# Patient Record
Sex: Male | Born: 1965 | Race: Black or African American | Hispanic: No | Marital: Married | Smoking: Never smoker
Health system: Southern US, Community
[De-identification: ages and names within clinical notes are randomized; demographics above are authoritative.]

## PROBLEM LIST (undated history)

## (undated) DIAGNOSIS — M25569 Pain in unspecified knee: Secondary | ICD-10-CM

## (undated) DIAGNOSIS — E785 Hyperlipidemia, unspecified: Secondary | ICD-10-CM

## (undated) DIAGNOSIS — J45909 Unspecified asthma, uncomplicated: Secondary | ICD-10-CM

## (undated) DIAGNOSIS — R7989 Other specified abnormal findings of blood chemistry: Secondary | ICD-10-CM

## (undated) DIAGNOSIS — M549 Dorsalgia, unspecified: Secondary | ICD-10-CM

## (undated) HISTORY — DX: Unspecified asthma, uncomplicated: J45.909

## (undated) HISTORY — DX: Dorsalgia, unspecified: M54.9

## (undated) HISTORY — DX: Hyperlipidemia, unspecified: E78.5

## (undated) HISTORY — DX: Other specified abnormal findings of blood chemistry: R79.89

## (undated) HISTORY — DX: Pain in unspecified knee: M25.569

---

## 2005-12-23 ENCOUNTER — Ambulatory Visit: Payer: Self-pay | Admitting: Family Medicine

## 2006-01-06 ENCOUNTER — Ambulatory Visit: Payer: Self-pay | Admitting: Family Medicine

## 2006-01-06 LAB — CONVERTED CEMR LAB: Blood Glucose, Fasting: 92 mg/dL

## 2006-04-28 ENCOUNTER — Ambulatory Visit: Payer: Self-pay | Admitting: Family Medicine

## 2007-11-25 ENCOUNTER — Encounter: Payer: Self-pay | Admitting: Family Medicine

## 2007-11-25 DIAGNOSIS — J45909 Unspecified asthma, uncomplicated: Secondary | ICD-10-CM | POA: Insufficient documentation

## 2007-12-30 ENCOUNTER — Ambulatory Visit: Payer: Self-pay | Admitting: Family Medicine

## 2007-12-30 DIAGNOSIS — S335XXA Sprain of ligaments of lumbar spine, initial encounter: Secondary | ICD-10-CM

## 2008-02-15 ENCOUNTER — Telehealth: Payer: Self-pay | Admitting: Family Medicine

## 2008-03-08 ENCOUNTER — Encounter: Payer: Self-pay | Admitting: Family Medicine

## 2008-04-05 ENCOUNTER — Ambulatory Visit: Payer: Self-pay | Admitting: Family Medicine

## 2008-04-05 DIAGNOSIS — H6120 Impacted cerumen, unspecified ear: Secondary | ICD-10-CM | POA: Insufficient documentation

## 2008-05-31 ENCOUNTER — Ambulatory Visit: Payer: Self-pay | Admitting: Family Medicine

## 2008-06-21 ENCOUNTER — Ambulatory Visit: Payer: Self-pay | Admitting: Family Medicine

## 2008-06-22 LAB — CONVERTED CEMR LAB
Albumin: 3.5 g/dL (ref 3.5–5.2)
BUN: 20 mg/dL (ref 6–23)
Calcium: 9.3 mg/dL (ref 8.4–10.5)
Cholesterol: 222 mg/dL (ref 0–200)
Creatinine, Ser: 1.3 mg/dL (ref 0.4–1.5)
Direct LDL: 155.6 mg/dL
GFR calc Af Amer: 77 mL/min
GFR calc non Af Amer: 64 mL/min
HDL: 38.9 mg/dL — ABNORMAL LOW (ref >39.0)
Triglycerides: 107 mg/dL (ref 0–149)
VLDL: 21 mg/dL (ref 0–40)

## 2008-12-29 ENCOUNTER — Encounter (INDEPENDENT_AMBULATORY_CARE_PROVIDER_SITE_OTHER): Payer: Self-pay | Admitting: Surgery

## 2008-12-29 ENCOUNTER — Ambulatory Visit (HOSPITAL_BASED_OUTPATIENT_CLINIC_OR_DEPARTMENT_OTHER): Admission: RE | Admit: 2008-12-29 | Discharge: 2008-12-29 | Payer: Self-pay | Admitting: Surgery

## 2008-12-29 HISTORY — PX: LIPOMA EXCISION: SHX5283

## 2009-01-30 ENCOUNTER — Ambulatory Visit: Payer: Self-pay | Admitting: Family Medicine

## 2009-06-26 ENCOUNTER — Ambulatory Visit: Payer: Self-pay | Admitting: Family Medicine

## 2009-06-28 ENCOUNTER — Ambulatory Visit: Payer: Self-pay | Admitting: Family Medicine

## 2009-06-28 DIAGNOSIS — E7800 Pure hypercholesterolemia, unspecified: Secondary | ICD-10-CM

## 2009-06-28 LAB — CONVERTED CEMR LAB
AST: 20 units/L (ref 0–37)
Alkaline Phosphatase: 33 units/L — ABNORMAL LOW (ref 39–117)
Calcium: 9.3 mg/dL (ref 8.4–10.5)
Cholesterol: 257 mg/dL — ABNORMAL HIGH (ref 0–200)
Direct LDL: 191 mg/dL
GFR calc non Af Amer: 60.44 mL/min (ref >60)
HDL: 48.3 mg/dL (ref >39.00)
Potassium: 4.6 meq/L (ref 3.5–5.1)
Sodium: 142 meq/L (ref 135–145)
Total Bilirubin: 0.8 mg/dL (ref 0.3–1.2)
Total CHOL/HDL Ratio: 5
VLDL: 16.2 mg/dL (ref 0.0–40.0)

## 2009-07-03 ENCOUNTER — Ambulatory Visit: Payer: Self-pay | Admitting: Family Medicine

## 2009-07-03 DIAGNOSIS — M722 Plantar fascial fibromatosis: Secondary | ICD-10-CM | POA: Insufficient documentation

## 2009-08-15 ENCOUNTER — Ambulatory Visit: Payer: Self-pay | Admitting: Family Medicine

## 2009-08-15 LAB — CONVERTED CEMR LAB: ALT: 34 units/L (ref 0–53)

## 2009-10-03 ENCOUNTER — Ambulatory Visit: Payer: Self-pay | Admitting: Family Medicine

## 2009-10-03 LAB — CONVERTED CEMR LAB
BUN: 19 mg/dL (ref 6–23)
Chloride: 106 meq/L (ref 96–112)
Cholesterol: 233 mg/dL — ABNORMAL HIGH (ref 0–200)
Creatinine, Ser: 1.3 mg/dL (ref 0.4–1.5)
Direct LDL: 162.2 mg/dL
GFR calc non Af Amer: 76.71 mL/min (ref >60)
HDL: 52.3 mg/dL (ref >39.00)
Total CHOL/HDL Ratio: 4
VLDL: 18.6 mg/dL (ref 0.0–40.0)

## 2009-10-11 ENCOUNTER — Ambulatory Visit: Payer: Self-pay | Admitting: Family Medicine

## 2009-11-20 ENCOUNTER — Ambulatory Visit: Payer: Self-pay | Admitting: Family Medicine

## 2010-03-26 ENCOUNTER — Encounter (INDEPENDENT_AMBULATORY_CARE_PROVIDER_SITE_OTHER): Payer: Self-pay | Admitting: *Deleted

## 2010-07-01 ENCOUNTER — Telehealth: Payer: Self-pay | Admitting: Family Medicine

## 2010-07-08 ENCOUNTER — Telehealth (INDEPENDENT_AMBULATORY_CARE_PROVIDER_SITE_OTHER): Payer: Self-pay | Admitting: *Deleted

## 2010-07-15 ENCOUNTER — Ambulatory Visit: Payer: Self-pay | Admitting: Family Medicine

## 2010-07-15 LAB — CONVERTED CEMR LAB
Alkaline Phosphatase: 29 units/L — ABNORMAL LOW (ref 39–117)
Bilirubin, Direct: 0.1 mg/dL (ref 0.0–0.3)
CO2: 27 meq/L (ref 19–32)
Calcium: 9.3 mg/dL (ref 8.4–10.5)
Creatinine, Ser: 1.5 mg/dL (ref 0.4–1.5)
Direct LDL: 193.9 mg/dL
GFR calc non Af Amer: 64.31 mL/min (ref >60)
HDL: 53.4 mg/dL (ref >39.00)
Sodium: 140 meq/L (ref 135–145)
Total Bilirubin: 0.5 mg/dL (ref 0.3–1.2)
Total CHOL/HDL Ratio: 5
Total Protein: 6.8 g/dL (ref 6.0–8.3)
Triglycerides: 71 mg/dL (ref 0.0–149.0)
VLDL: 14.2 mg/dL (ref 0.0–40.0)

## 2010-07-18 ENCOUNTER — Ambulatory Visit: Payer: Self-pay | Admitting: Family Medicine

## 2010-09-17 NOTE — Progress Notes (Signed)
----   Converted from flag ---- ---- 07/05/2010 1:01 PM, Crawford Givens MD wrote: cmet/lipid 272.0  ---- 07/05/2010 12:10 PM, Liane Comber CMA (AAMA) wrote: Lab orders please! Good Morning! This pt is scheduled for cpx labs Monday, which labs to draw and dx codes to use? Thanks Tasha ------------------------------

## 2010-09-17 NOTE — Assessment & Plan Note (Signed)
Summary: CPX/JRR   Vital Signs:  Patient profile:   45 year old male Height:      70 inches Weight:      208.75 pounds BMI:     30.06 Temp:     97.9 degrees F oral Pulse rate:   72 / minute Pulse rhythm:   regular BP sitting:   124 / 84  (left arm) Cuff size:   large  Vitals Entered By: Delilah Shan CMA Duncan Dull) (July 18, 2010 8:37 AM) CC: CPX   History of Present Illness: CPE- see plan.  H/o mild increase in cr- Frequent ibuprofen use and h/o protein supplements/creatine supplements.  Non h/o htn.  See plan.   h/o HLD.  Labs d/w patient.  See plan.   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 Father dec 62  5/10 prostate cancer (dx'd late 22s, early 70s) Mother  dec 55 diabetes// MSOF in hospital  Siblings: None cv: negative hbp: + M UNCLES DM + MOTHER AND M AUNTS GOUT/ARTHRITIS: PROSTATE CANCER: NEGATIVE BREAST/OVARIAN.UTERINE CANCER: NEGATIVE COLON CANCER:NEGATIVE DEPRESSION: NEGATIVE ETOH ABUSE: +M UNCLE DRUG ABUSE: NEGATIVE OTHER: NEGATIVE STROKE  Social History: Reviewed history from 11/25/2007 and no changes required. 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  Otherwise negative.    Physical Exam  General:  GEN: nad, alert and oriented, muscular HEENT: mucous membranes moist NECK: supple w/o LA CV: rrr.  no murmur PULM: ctab, no inc wob ABD:  soft, +bs EXT: no edema SKIN: no acute rash    Impression & Recommendations:  Problem # 1:  HEALTH MAINTENANCE EXAM (ICD-V70.0) flu shot done today and tdap up to date.  d/w patient ZO:XWRU and cont exercise.  Will need prostate CA screening in late 40s and colon Ca screen at 50.    Problem # 2:  RENAL INSUFFICIENCY (ICD-588.9) D/w patient EA:[id number] NSAIDS and supplements and rechecking cr in 6 months.  he agrees. Use ultram as needed for joint pain.    Problem # 3:  PURE HYPERCHOLESTEROLEMIA (ICD-272.0) return for labs after stopping supplements.  Consider statin if still elevated.   Complete Medication List: 1)  Multivitamins Tabs (Multiple vitamin) .Marland Kitchen.. 1 daily by mouth 2)  Ultram 50 Mg Tabs (Tramadol hcl) .... One tab by mouth 4 times a day as needed for back pain 3)  Glucosamine-chondroitin 500-400 Mg Caps (Glucosamine-chondroitin) .... Take one by mouth daily 4)  Guaifenesin 400 Mg Tabs (Guaifenesin) .... As needed  Other Orders: Admin 1st Vaccine (14782) Flu Vaccine 17yrs + (95621)  Patient Instructions: 1)  Recheck lipids and cmet in 6 months.  fasting.  272.0 2)  Glad to see you today.  Let me know if you have other concerns.  3)  I sent in your tramadol to [facility].  4)  Stop taking ibuprofen and the weight lifting supplements.   Prescriptions: ULTRAM 50 MG  TABS (TRAMADOL HCL) one tab by mouth 4  times a day as needed for back pain  #360 x 3   Entered and Authorized by:   Crawford Givens MD   Signed by:   Crawford Givens MD on 07/18/2010   Method used:   Electronically to        [facility] [address] (retail)       [address], [address]       [place], [place]  [address]       Ph: [phone]       Fax: [phone]   RxID:   [id number]    Orders Added: 1)  New Patient 40-64 years [99386] 2)  Est. Patient Level III [99213] 3)  Admin 1st Vaccine [90471] 4)  Flu Vaccine 31yrs + [03474]    Current Allergies (reviewed  today): No known allergies Flu Vaccine Consent Questions     Do you have a history of severe allergic reactions to this vaccine? no    Any prior history of allergic reactions to egg and/or gelatin? no    Do you have a sensitivity to the preservative Thimersol? no    Do you have a past history of Guillan-Barre Syndrome? no    Do you currently have an acute febrile illness? no    Have you ever had a severe reaction to latex? no    Vaccine information given and explained to patient? yes    Are you currently pregnant? no    Lot Number:[id number]   Exp Date:02/15/2011   Site Given  Left Deltoid IM  Lugene Fuquay CMA (AAMA)  July 18, 2010 9:32 AM   .lbflu

## 2010-09-17 NOTE — Progress Notes (Signed)
Summary: Rx Tramadol  Phone Note Refill Request Call back at [phone] Message from:  [facility] on July 01, 2010 3:59 PM  Refills Requested: Medication #1:  ULTRAM 50 MG  TABS one tab by mouth twice  daily as needed for back pain   Last Refilled: 05/19/2010 Patient is scheduled to see you 07/18/10.  Patient called also, please let patient know when this is refilled.   Method Requested: Electronic Initial call taken by: Sydell Axon LPN,  July 01, 2010 4:00 PM  Follow-up for Phone Call        please notify patient.  Follow-up by: Crawford Givens MD,  July 01, 2010 10:20 PM  Additional Follow-up for Phone Call Additional follow up Details #1::        Medication phoned to pharmacy. Patient Advised.  Additional Follow-up by: Delilah Shan CMA Duncan Dull),  July 02, 2010 9:55 AM    Prescriptions: ULTRAM 50 MG  TABS (TRAMADOL HCL) one tab by mouth twice  daily as needed for back pain  #80 x 2   Entered and Authorized by:   Crawford Givens MD   Signed by:   Crawford Givens MD on 07/01/2010   Method used:   Electronically to        [facility] [address] (retail)       [address]       [place], [place]  [address]       Ph: [phone]       Fax: [phone]   RxID:   [id number]

## 2010-09-17 NOTE — Assessment & Plan Note (Signed)
Summary: sore throat/alc   Vital Signs:  Patient profile:   45 year old male Weight:      210.75 pounds BMI:     30.35 Temp:     97.9 degrees F oral Pulse rate:   60 / minute Pulse rhythm:   regular BP sitting:   124 / 80  (left arm) Cuff size:   large  Vitals Entered By: Sydell Axon LPN (November 20, 1608 3:56 PM) CC: Swollen glands, right ear aches and sore throat   History of Present Illness: Pt nhere for congestrion. He denies fever or chills, he has no headache, right ear hurts some, no rhinitis but mild nasal congestionb, ST with right submandibular gland hypertrophy,  mild cough nonproductive. He has no SOB, no N/V. He is sleeping ok from the congestion. Her has taken Dayquil some. Otherwise has tqken nothing else. He has had ST and congestion off and on now for quite some time.  Problems Prior to Update: 1)  Uri  (ICD-465.9) 2)  Plantar Fasciitis, Bilateral  (ICD-728.71) 3)  Renal Insufficiency  (ICD-588.9) 4)  Pure Hypercholesterolemia  (ICD-272.0) 5)  Health Maintenance Exam  (ICD-V70.0) 6)  Cerumen Impaction, Left  (ICD-380.4) 7)  Back Strain,upper Umbar  (ICD-847.2) 8)  Lipoma, Skin, R Posterior Neck  (ICD-214.1) 9)  Asthma, Childhood  (ICD-493.00)  Medications Prior to Update: 1)  Multivitamins   Tabs (Multiple Vitamin) .Marland Kitchen.. 1 Daily By Mouth 2)  Ultram 50 Mg  Tabs (Tramadol Hcl) .... One Tab By Mouth Twice  Daily As Needed For Back Pain 3)  Glucosamine-Chondroitin 500-400 Mg Caps (Glucosamine-Chondroitin) .... Take One By Mouth Daily 4)  Guaifenesin 400 Mg Tabs (Guaifenesin) .... As Needed  Allergies: No Known Drug Allergies  Physical Exam  General:  Well-developed,well-nourished,in no acute distress; alert,appropriate and cooperative throughout examination Head:  Normocephalic and atraumatic without obvious abnormalities. No apparent alopecia or balding. Sinuses minimally tender in max distr. Eyes:  Conjunctiva clear bilaterally.  Ears:  External ear exam  shows no significant lesions or deformities.  Otoscopic examination reveals clear canals, tympanic membranes are intact bilaterally without bulging, retraction, inflammation or discharge. Hearing is grossly normal bilaterally. TMs dull. Nose:  External nasal examination shows no deformity or inflammation. Nasal mucosa are pink and moist without lesions or exudates. No real inflammation but mild clear discharge.. Mouth:  Oral mucosa and oropharynx without lesions or exudates.  Teeth in good repair. Mild thick PND. Neck:  No deformities, masses, or tenderness noted. Chest Wall:  No deformities, masses, tenderness or gynecomastia noted. Lungs:  Normal respiratory effort, chest expands symmetrically. Lungs are clear to auscultation, no crackles or wheezes. Heart:  Normal rate and regular rhythm. S1 and S2 normal without gallop, murmur, click, rub or other extra sounds.   Impression & Recommendations:  Problem # 1:  URI (ICD-465.9) Assessment Unchanged  Ongoing. See instructions. His updated medication list for this problem includes:    Guaifenesin 400 Mg Tabs (Guaifenesin) .Marland Kitchen... As needed  Instructed on symptomatic treatment. Call if symptoms persist or worsen.   Complete Medication List: 1)  Multivitamins Tabs (Multiple vitamin) .Marland Kitchen.. 1 daily by mouth 2)  Ultram 50 Mg Tabs (Tramadol hcl) .... One tab by mouth twice  daily as needed for back pain 3)  Glucosamine-chondroitin 500-400 Mg Caps (Glucosamine-chondroitin) .... Take one by mouth daily 4)  Guaifenesin 400 Mg Tabs (Guaifenesin) .... As needed 5)  Amoxicillin 500 Mg Caps (Amoxicillin) .... 2 tabs by mouth two times a day  Patient  Instructions: 1)  Take Amox  2)  Take Guaifenesin by [facility] or [facility] and getting MUCOUS RELIEF EXPECTORANT (400mg ), take 11/2 tabs by mouth AM and NOON. 3)  Drink lots of fluids anytime taking Guaifenesin.  4)  ake Tyl ES 2 tabs by mouth three times a day  5)  Keep lozenge in  mouth all the time. 6)  Gargle every 1/2 hr for 2 days. Prescriptions: AMOXICILLIN 500 MG CAPS (AMOXICILLIN) 2 tabs by mouth two times a day  #56 x 0   Entered and Authorized by:   Shaune Leeks MD   Signed by:   Shaune Leeks MD on 11/20/2009   Method used:   Electronically to        [facility] [address]ail)       [address], [address]       [place], [place]  [address]       Ph: [phone]       Fax: [phone]   RxID:   [id number]   Current Allergies (reviewed today): No known allergies

## 2010-09-17 NOTE — Letter (Signed)
Summary: Nadara Eaton letter  [place] [facility]  [address] [place], [place] [address]   Phone: [phone]  Fax: [phone]       03/26/2010 MRN: [id number]  [facility] [address] [place], [place]  [address]  Dear Mr. Heatherly,  [facility], and [facility] announce the retirement of Arta Silence, M.D., from full-time practice at the [facility] office effective February 14, 2010 and his plans of returning part-time.  It is important to Dr. Hetty Ely and to our practice that you understand that [facility] has seven physicians in our office for your health care needs.  We will continue to offer the same exceptional care that you have today.    Dr. Hetty Ely has spoken to many of you about his plans for retirement and returning part-time in the fall.   We will continue to work with you through the transition to schedule appointments for you in the office and meet the high standards that [facility] is committed to.   Again, it is with great pleasure that we share the news that Dr. Hetty Ely will return to [facility] in October of 2011 with a reduced schedule.    If you have any questions, or would like to request an appointment with one of our physicians, please call us at [phone] and press the option for Scheduling an appointment.  We take pleasure in providing you with excellent patient care and look forward to seeing you at your next office visit.  Our [facility] Physicians are:  Tillman Abide, M.D. Laurita Quint, M.D. Roxy Manns, M.D. Kerby Nora, M.D. Hannah Beat, M.D. Ruthe Mannan, M.D. We proudly welcomed Raechel Ache, M.D. and Eustaquio Boyden, M.D. to the practice in July/August 2011.  Sincerely,  [facility]

## 2010-09-17 NOTE — Assessment & Plan Note (Signed)
Summary: 3 MONTH FOLLOW UP/[facility]   Vital Signs:  Patient profile:   45 year old male Weight:      212 pounds Temp:     98.0 degrees F oral Pulse rate:   60 / minute Pulse rhythm:   regular BP sitting:   124 / 84  (left arm) Cuff size:   large  Vitals Entered By: Sydell Axon LPN (October 11, 2009 10:20 AM) CC: 3 Month follow-up after labs, stopped taking Pravachol because he did not think that it was good, has had a cold, coughing up a little stuff   History of Present Illness: Pt here for followup of chol. He stopped taking his statin because he increased his working out and wanted to avoid taking meds if he could. He feels well and has been doing more cardio. He has congestion. He had h/a last week with ST, no ear p[ain, mild nasal congestion and minimally productive cough. He denies fever or chills. His appetite has been fine and he has no N/V.  Problems Prior to Update: 1)  Plantar Fasciitis, Bilateral  (ICD-728.71) 2)  Renal Insufficiency  (ICD-588.9) 3)  Pure Hypercholesterolemia  (ICD-272.0) 4)  Health Maintenance Exam  (ICD-V70.0) 5)  Cerumen Impaction, Left  (ICD-380.4) 6)  Back Strain,upper Umbar  (ICD-847.2) 7)  Lipoma, Skin, R Posterior Neck  (ICD-214.1) 8)  Asthma, Childhood  (ICD-493.00)  Medications Prior to Update: 1)  Multivitamins   Tabs (Multiple Vitamin) .Marland Kitchen.. 1 Daily By Mouth 2)  Ultram 50 Mg  Tabs (Tramadol Hcl) .... One Tab By Mouth Twice  Daily As Needed For Back Pain 3)  Glucosamine-Chondroitin 500-400 Mg Caps (Glucosamine-Chondroitin) .... Take One By Mouth Daily 4)  Pravachol 40 Mg Tabs (Pravastatin Sodium) .... One Tab By Mouth At Night  Allergies: No Known Drug Allergies  Physical Exam  General:  Well-developed,well-nourished,in no acute distress; alert,appropriate and cooperative throughout examination Head:  Normocephalic and atraumatic without obvious abnormalities. No apparent alopecia or balding. Sinuses nontender. Eyes:  Conjunctiva clear  bilaterally.  Ears:  External ear exam shows no significant lesions or deformities.  Otoscopic examination reveals clear canals, tympanic membranes are intact bilaterally without bulging, retraction, inflammation or discharge. Hearing is grossly normal bilaterally. TMs dull. Nose:  External nasal examination shows no deformity or inflammation. Nasal mucosa are pink and moist without lesions or exudates. No real inflammation but mild clear discharge.. Mouth:  Oral mucosa and oropharynx without lesions or exudates.  Teeth in good repair. Mild thick PND. Neck:  No deformities, masses, or tenderness noted. Lungs:  Normal respiratory effort, chest expands symmetrically. Lungs are clear to auscultation, no crackles or wheezes. Heart:  Normal rate and regular rhythm. S1 and S2 normal without gallop, murmur, click, rub or other extra sounds.   Impression & Recommendations:  Problem # 1:  PURE HYPERCHOLESTEROLEMIA (ICD-272.0) Assessment Improved LDL from 191 to 162 with exercise which is alomost at goal of 160 for him....male his only risk factor.   OK if continues exercise level he is currently maintaining. The following medications were removed from the medication list:    Pravachol 40 Mg Tabs (Pravastatin sodium) ..... One tab by mouth at night  Problem # 2:  URI (ICD-465.9) Cont Guaif...discussed approach. Call if worsens. His updated medication list for this problem includes:    Guaifenesin 400 Mg Tabs (Guaifenesin) .Marland Kitchen... As needed  Instructed on symptomatic treatment. Call if symptoms persist or worsen.   Complete Medication List: 1)  Multivitamins Tabs (Multiple vitamin) .Marland KitchenMarland KitchenMarland Kitchen 1  daily by mouth 2)  Ultram 50 Mg Tabs (Tramadol hcl) .... One tab by mouth twice  daily as needed for back pain 3)  Glucosamine-chondroitin 500-400 Mg Caps (Glucosamine-chondroitin) .... Take one by mouth daily 4)  Guaifenesin 400 Mg Tabs (Guaifenesin) .... As needed  Patient Instructions: 1)  RTC 10/11 for  PE.  Current Allergies (reviewed today): No known allergies

## 2010-09-30 ENCOUNTER — Encounter: Payer: Self-pay | Admitting: Family Medicine

## 2010-09-30 ENCOUNTER — Ambulatory Visit (INDEPENDENT_AMBULATORY_CARE_PROVIDER_SITE_OTHER): Payer: Medicare HMO | Admitting: Family Medicine

## 2010-09-30 DIAGNOSIS — J069 Acute upper respiratory infection, unspecified: Secondary | ICD-10-CM | POA: Insufficient documentation

## 2010-10-09 NOTE — Assessment & Plan Note (Signed)
Summary: ST,SWOLLEN GLANDS/CLE   AETNA   Vital Signs:  Patient profile:   45 year old male Height:      70 inches Weight:      212.25 pounds BMI:     30.56 Temp:     98.4 degrees F oral Pulse rate:   76 / minute Pulse rhythm:   regular BP sitting:   122 / 70  (left arm) Cuff size:   large  Vitals Entered By: Delilah Shan CMA Jayden Kratochvil Dull) (September 30, 2010 11:31 AM) iCC: ? sinus infection, sinus pressure, ST, swollen glands   History of Present Illness: Sinus pressure and HA, congestion.  Had some swelling in this glands in neck.  Started about 1.5 weeks ago.    Started with ST but then this resolved.  No rhinorrhea, some AM sputum.  Some cough.  Out of work today.  "I'm getting better but slowly."    No FCNAVD.    +sick contacts.    Allergies: No Known Drug Allergies  Review of Systems       See HPI.  Otherwise negative.    Physical Exam  General:  GEN: nad, alert and oriented HEENT: mucous membranes moist, TM w/o erythema, nasal epithelium injected, OP with cobblestoning NECK: supple w/o LA CV: rrr. PULM: ctab, no inc wob ABD: soft, +bs EXT: no edema  max and frontal sinus not tender to palpation    Impression & Recommendations:  Problem # 1:  URI (ICD-465.9) Likely viral, resolving.  I would use otc meds below and follow up as needed.  Should resolve fully on its own.  He understood. Rationale d/w patient. nontoxic.  His updated medication list for this problem includes:    Guaifenesin 400 Mg Tabs (Guaifenesin) .Marland Kitchen... As needed    Claritin-d 12 Hour 5-120 Mg Xr12h-tab (Loratadine-pseudoephedrine) .Marland Kitchen... 1 by mouth two times a day as needed for congestion  Complete Medication List: 1)  Multivitamins Tabs (Multiple vitamin) .Marland Kitchen.. 1 daily by mouth 2)  Ultram 50 Mg Tabs (Tramadol hcl) .... One tab by mouth 4 times a day as needed for back pain 3)  Glucosamine-chondroitin 500-400 Mg Caps (Glucosamine-chondroitin) .... Take one by mouth daily 4)  Guaifenesin 400 Mg  Tabs (Guaifenesin) .... As needed 5)  Claritin-d 12 Hour 5-120 Mg Xr12h-tab (Loratadine-pseudoephedrine) .Marland Kitchen.. 1 by mouth two times a day as needed for congestion  Patient Instructions: 1)  Get plenty of rest, drink lots of clear liquids, and use Tylenol and claritin D as needed. This should gradually get better.  Take care.  Prescriptions: CLARITIN-D 12 HOUR 5-120 MG XR12H-TAB (LORATADINE-PSEUDOEPHEDRINE) 1 by mouth two times a day as needed for congestion  #30 x 0   Entered and Authorized by:   Crawford Givens MD   Signed by:   Crawford Givens MD on 09/30/2010   Method used:   Print then Give to Patient   RxID:   [id number]    Orders Added: 1)  Est. Patient Level III [14782]    Current Allergies (reviewed today): No known allergies

## 2010-10-09 NOTE — Letter (Signed)
Summary: Out of Work  [employer] at [facility]  [address] [place], [place] [address]   Phone: [phone]  Fax: [phone]    September 30, 2010   Employee:  [employer]ifton [employer]ust[employer]    To Whom It May Concern:   For Medical reasons, please excuse the above named employee from work for the following dates:  Start:   today  End:   return to work 10/01/10  If you need additional information, please feel free to contact our office.         Sincerely,    Crawford Givens MD

## 2010-11-26 LAB — POCT HEMOGLOBIN-HEMACUE: Hemoglobin: 15.5 g/dL (ref 13.0–17.0)

## 2010-12-31 NOTE — Op Note (Signed)
NAME:  DOLPH, TAVANO NO.:  [id number]   MEDICAL RECORD NO.:  [id number]          PATIENT TYPE:  AMB   LOCATION:  DSC                          FACILITY:  [facility]   PHYSICIAN:  Thornton Park. Daphine Deutscher, MD  DATE OF BIRTH:  [birth year]   DATE OF PROCEDURE:  DATE OF DISCHARGE:                               OPERATIVE REPORT   PREOPERATIVE DIAGNOSIS:  Mass of right posterior neck.   POSTOPERATIVE DIAGNOSIS:  Mass of right posterior neck.   PROCEDURE:  Excision of mass of the right posterior neck.   DESCRIPTION OF PROCEDURE:  Mr. Och, the place of his mass at the neck  was marked by me in the holding area, and he was taken back to room 8 on  Friday, Dec 29, 2008, and placed in the prone position with some  intravenous sedation given.  I prepped him with Techni-Care equivalent  and then draping sterilely.  I infiltrated the area with generous amount  of 1% lidocaine and neut.  A transverse incision following the  previously made mark was done, carried this down to the fairly thick  skin into a subcutaneous pocket finding about 3 cm x 3 cm fatty mass  that was encapsulated.  I teased this out.  I eventually removed it  using sharp dissection at the base and cauterized the blood supply.  No  other palpable mass was noted.  I then closed this in layers of 4-0  Vicryl subcutaneously and subcuticularly and with Dermabond.  The  patient tolerated procedure well.  He was given some Vicodin to take for  pain and will be followed up in the office about 3 weeks.      Thornton Park Daphine Deutscher, MD  Electronically Signed     MBM/MEDQ  D:  12/29/2008  T:  12/30/2008  Job:  [id number]   cc:   Arta Silence, MD

## 2011-01-21 ENCOUNTER — Other Ambulatory Visit: Payer: Self-pay | Admitting: Family Medicine

## 2011-01-21 DIAGNOSIS — E7800 Pure hypercholesterolemia, unspecified: Secondary | ICD-10-CM

## 2011-01-23 ENCOUNTER — Other Ambulatory Visit (INDEPENDENT_AMBULATORY_CARE_PROVIDER_SITE_OTHER): Payer: Managed Care, Other (non HMO) | Admitting: Family Medicine

## 2011-01-23 DIAGNOSIS — E7800 Pure hypercholesterolemia, unspecified: Secondary | ICD-10-CM

## 2011-01-23 LAB — LDL CHOLESTEROL, DIRECT: Direct LDL: 183.9 mg/dL

## 2011-01-23 LAB — COMPREHENSIVE METABOLIC PANEL WITH GFR
ALT: 30 U/L (ref 0–53)
AST: 28 U/L (ref 0–37)
Albumin: 3.9 g/dL (ref 3.5–5.2)
Alkaline Phosphatase: 33 U/L — ABNORMAL LOW (ref 39–117)
BUN: 21 mg/dL (ref 6–23)
CO2: 26 meq/L (ref 19–32)
Calcium: 8.6 mg/dL (ref 8.4–10.5)
Chloride: 104 meq/L (ref 96–112)
Creatinine, Ser: 1.4 mg/dL (ref 0.4–1.5)
GFR: 68.88 mL/min
Glucose, Bld: 101 mg/dL — ABNORMAL HIGH (ref 70–99)
Potassium: 4.3 meq/L (ref 3.5–5.1)
Sodium: 140 meq/L (ref 135–145)
Total Bilirubin: 0.3 mg/dL (ref 0.3–1.2)
Total Protein: 7 g/dL (ref 6.0–8.3)

## 2011-01-23 LAB — LIPID PANEL: Cholesterol: 266 mg/dL — ABNORMAL HIGH (ref 0–200)

## 2011-01-29 ENCOUNTER — Encounter: Payer: Self-pay | Admitting: Family Medicine

## 2011-01-31 ENCOUNTER — Encounter: Payer: Self-pay | Admitting: Family Medicine

## 2011-01-31 ENCOUNTER — Ambulatory Visit: Payer: Managed Care, Other (non HMO) | Admitting: Family Medicine

## 2011-01-31 ENCOUNTER — Ambulatory Visit (INDEPENDENT_AMBULATORY_CARE_PROVIDER_SITE_OTHER): Payer: Managed Care, Other (non HMO) | Admitting: Family Medicine

## 2011-01-31 DIAGNOSIS — E7800 Pure hypercholesterolemia, unspecified: Secondary | ICD-10-CM

## 2011-01-31 DIAGNOSIS — M25569 Pain in unspecified knee: Secondary | ICD-10-CM

## 2011-01-31 MED ORDER — TRAMADOL HCL 50 MG PO TABS: 50.0000 mg | ORAL_TABLET | Freq: Four times a day (QID) | ORAL | Status: AC | PRN

## 2011-01-31 NOTE — Progress Notes (Signed)
Knee pain, better with prn tramadol.  Still exercising and doing well o/w.   HLD- D/w pt about lipids.  No h/o early CAD in family.  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  No sig inc in weight.  He appears fit with good muscle mass.  No known CAD in patient.  No h/o HTN, prev CP checks all wnl in clinic at and home.  No CP/SOB/DM2 in the patient.  See plan.  Meds, vitals, and allergies reviewed.   ROS: See HPI.  Otherwise, noncontributory.  nad ncat Mmm rrr ctab Ext well perfused.

## 2011-01-31 NOTE — Patient Instructions (Signed)
I would keep a check on your BP occasionally.  Let me know if it is running >140/90.  Keep exercising and working on your diet.  Take care.  After talking with you and checking you today, I don't think you have to go on cholesterol medicine.

## 2011-02-02 ENCOUNTER — Encounter: Payer: Self-pay | Admitting: Family Medicine

## 2011-02-02 DIAGNOSIS — M25569 Pain in unspecified knee: Secondary | ICD-10-CM | POA: Insufficient documentation

## 2011-02-02 NOTE — Assessment & Plan Note (Signed)
I talked with patient about this.  No personal/FH CAD.  He isn't diabetic.  He is fit.  He doesn't smoke.  I can't guarantee the patient that he would tolerate or benefit from statin use.  He doesn't have definite indication.  At this point, I would continue exercise. He'll notify us of inc in BP.  I would check sugar periodically given FH of DM.  He agreed with the plan.

## 2011-02-02 NOTE — Assessment & Plan Note (Signed)
No change in meds, continue prn tramadol and exercising.

## 2011-03-31 ENCOUNTER — Ambulatory Visit (INDEPENDENT_AMBULATORY_CARE_PROVIDER_SITE_OTHER): Payer: Managed Care, Other (non HMO) | Admitting: Family Medicine

## 2011-03-31 ENCOUNTER — Encounter: Payer: Self-pay | Admitting: Family Medicine

## 2011-03-31 DIAGNOSIS — M79609 Pain in unspecified limb: Secondary | ICD-10-CM

## 2011-03-31 DIAGNOSIS — M79662 Pain in left lower leg: Secondary | ICD-10-CM | POA: Insufficient documentation

## 2011-03-31 DIAGNOSIS — R7989 Other specified abnormal findings of blood chemistry: Secondary | ICD-10-CM | POA: Insufficient documentation

## 2011-03-31 DIAGNOSIS — M79661 Pain in right lower leg: Secondary | ICD-10-CM

## 2011-03-31 DIAGNOSIS — R799 Abnormal finding of blood chemistry, unspecified: Secondary | ICD-10-CM

## 2011-03-31 DIAGNOSIS — M25569 Pain in unspecified knee: Secondary | ICD-10-CM

## 2011-03-31 MED ORDER — GLUCOSAMINE-CHONDROITIN 500-400 MG PO TABS: 1.0000 | ORAL_TABLET | Freq: Two times a day (BID) | ORAL | Status: AC

## 2011-03-31 NOTE — Patient Instructions (Signed)
Call me if the pain gets worse so we can get you set up with another appointment.  Make sure to stretch well before and after exercise.  Stay well hydrated.  Take glucosamine/chondroitin twice a day for a month along with the tramadol.  Take care.

## 2011-03-31 NOTE — Assessment & Plan Note (Signed)
Likely related to exercise.  D/w pt about stretching and hydration.  F/u prn.  Achilles not ttp x2.

## 2011-03-31 NOTE — Assessment & Plan Note (Signed)
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  We didn't image as he is getting by with tramadol. D/w pt about changing exercise routine to put less stress on knees.  I would hold off on image/injection and use this only if failed with tramadol.  D/w pt and he agreed. Okay for outpatient f/u.  Call back as needed.  If progressive, we can set pt up with Dr. Patsy Lager.

## 2011-03-31 NOTE — Progress Notes (Signed)
Knee pain and calf pain.   B knee pain. He was asking about injections for knee pain.  "It gets annoying."  Taking tramadol, some relief. Is able to get by on that.  B pain, near the patella.  No recent trauma.  Frequent exercise.    Calf cramping, bilateral.  He's been trying to stretch and use icy-hot.  Some relief.  "My calves are always tight."  Cramps started weeks ago.  Cramps worse at night.  Heavy exercising and standing at work.    Meds, vitals, and allergies reviewed.   ROS: See HPI.  Otherwise, noncontributory.  nad Knees and calves with normal inspection. Normal rom at knees. B exam with mild tenderness medial and lateral to patella.  Normal patellar rom with normal knee rom.  No meniscal click, ACL/MCL/LCL feel solid.  Calf exam symmetric w/o bands/cords, no bruising or erythema

## 2011-08-06 ENCOUNTER — Ambulatory Visit (INDEPENDENT_AMBULATORY_CARE_PROVIDER_SITE_OTHER): Payer: Managed Care, Other (non HMO) | Admitting: Family Medicine

## 2011-08-06 ENCOUNTER — Encounter: Payer: Self-pay | Admitting: Family Medicine

## 2011-08-06 DIAGNOSIS — H6980 Other specified disorders of Eustachian tube, unspecified ear: Secondary | ICD-10-CM

## 2011-08-06 DIAGNOSIS — H6990 Unspecified Eustachian tube disorder, unspecified ear: Secondary | ICD-10-CM | POA: Insufficient documentation

## 2011-08-06 DIAGNOSIS — M25569 Pain in unspecified knee: Secondary | ICD-10-CM

## 2011-08-06 MED ORDER — TRAMADOL HCL 50 MG PO TABS
50.0000 mg | ORAL_TABLET | Freq: Two times a day (BID) | ORAL | Status: DC | PRN
Start: 2011-08-06 — End: 2011-10-14

## 2011-08-06 NOTE — Patient Instructions (Addendum)
I would get a flu shot each fall (when you get better).   I would use nasal saline several times a day and consider using veramyst 1-2 sprays per nostril once a day.  This should gradually get better.   Take care.

## 2011-08-06 NOTE — Assessment & Plan Note (Signed)
Use nasal saline and samples of veramyst given.  This should resolve.  No indication for abx.  ddx d/w pt.  He understood.

## 2011-08-06 NOTE — Assessment & Plan Note (Signed)
Continue prn tramadol and exercise.

## 2011-08-06 NOTE — Progress Notes (Signed)
B ear aches.  Mild ST.  No fevers.  No rhinorrhea.  Stuffy, nasal.  No cough, no sputum.  Mult sick contacts.  2 days of sx.    Knee and back pain is tolerable with prn use of tramadol.   Needs refill.  No ADE.   Meds, vitals, and allergies reviewed.   ROS: See HPI.  Otherwise, noncontributory.  nad ncat Tm w/o erythema, no movement of TM on valsalva Nasal exam stuffy Op wnl Neck supple, no la rrr ctab

## 2011-08-14 ENCOUNTER — Telehealth: Payer: Self-pay | Admitting: Internal Medicine

## 2011-08-14 NOTE — Telephone Encounter (Signed)
Please give letter to pt.  Thanks.

## 2011-08-14 NOTE — Telephone Encounter (Signed)
Patient called and stated he was seen on 08/06/11 and he missed Sunday from work because he was still sick and wanted to know if could get a note for missing work on 08/10/11.  Please advise.

## 2011-08-14 NOTE — Telephone Encounter (Signed)
Patient notified as instructed by telephone.Letter at front desk for pick up.

## 2011-09-04 ENCOUNTER — Ambulatory Visit: Payer: Managed Care, Other (non HMO) | Admitting: Family Medicine

## 2011-09-05 ENCOUNTER — Ambulatory Visit (INDEPENDENT_AMBULATORY_CARE_PROVIDER_SITE_OTHER): Payer: Managed Care, Other (non HMO) | Admitting: Family Medicine

## 2011-09-05 ENCOUNTER — Encounter: Payer: Self-pay | Admitting: Family Medicine

## 2011-09-05 DIAGNOSIS — H6980 Other specified disorders of Eustachian tube, unspecified ear: Secondary | ICD-10-CM

## 2011-09-05 NOTE — Patient Instructions (Signed)
Use nasal saline spray in your nose twice a day.  If not any better, call me and we'll set up eval at the ENT clinic.

## 2011-09-05 NOTE — Progress Notes (Signed)
Prev with ETD, no sig change with veramyst prev.  Now with R ear pain.  This is different from prev.  He'll get burning on R side of head, going on episodically for last week.  Noted more at night.  No rhinorrhea, no fevers.  Mild ST over last week.  No cough, no sputum.  No post nasal gtt.  He can't pop his ears easily.   Meds, vitals, and allergies reviewed.   ROS: See HPI.  Otherwise, noncontributory.  GEN: nad, alert and oriented HEENT: mucous membranes moist, poor TM movement but no tm erythema, rinne and weber wnl x2, nasal exam slightly stuffy, OP wnl NECK: supple w/o LA CV: rrr PULM: ctab, no inc wob

## 2011-09-07 ENCOUNTER — Encounter: Payer: Self-pay | Admitting: Family Medicine

## 2011-09-07 NOTE — Assessment & Plan Note (Signed)
Possibly temp/cold air related?  That seems to be when he notices symptoms most.  Will use nasal saline, it not better, refer to ENT.   He agrees.

## 2011-09-08 ENCOUNTER — Ambulatory Visit: Payer: Managed Care, Other (non HMO) | Admitting: Family Medicine

## 2011-10-14 ENCOUNTER — Other Ambulatory Visit: Payer: Self-pay | Admitting: *Deleted

## 2011-10-14 MED ORDER — TRAMADOL HCL 50 MG PO TABS: 50.0000 mg | ORAL_TABLET | Freq: Three times a day (TID) | ORAL | Status: DC | PRN

## 2011-10-14 NOTE — Telephone Encounter (Signed)
LMOVM of home/cell number.

## 2011-10-14 NOTE — Telephone Encounter (Signed)
Sent, tell him I sent 270 pills, should last 90 days.  Thanks.

## 2011-10-14 NOTE — Telephone Encounter (Signed)
Patient called requesting a Rx refill for Tramadol.  He stated that Dr. Para March was going to increase the number of pills he would allow patient to have on this refill.  Please advise.

## 2011-12-23 ENCOUNTER — Encounter: Payer: Self-pay | Admitting: Family Medicine

## 2011-12-23 ENCOUNTER — Ambulatory Visit (INDEPENDENT_AMBULATORY_CARE_PROVIDER_SITE_OTHER): Payer: Managed Care, Other (non HMO) | Admitting: Family Medicine

## 2011-12-23 VITALS — BP 128/80 | HR 63 | Temp 98.1°F | Wt 214.0 lb

## 2011-12-23 DIAGNOSIS — E7800 Pure hypercholesterolemia, unspecified: Secondary | ICD-10-CM

## 2011-12-23 DIAGNOSIS — M25569 Pain in unspecified knee: Secondary | ICD-10-CM

## 2011-12-23 DIAGNOSIS — R5383 Other fatigue: Secondary | ICD-10-CM

## 2011-12-23 MED ORDER — TRAMADOL HCL 50 MG PO TABS: 100.0000 mg | ORAL_TABLET | Freq: Two times a day (BID) | ORAL | Status: DC | PRN

## 2011-12-23 NOTE — Progress Notes (Signed)
Knee pain.  No recent injury.  Pain is chronic with episodic flares.  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  Using up to 4 tramadol a day with relief, can get through the day with that.  No ADE.    Was taking OTC supplements for weight lifting.  We discussed that I couldn't assure the contents of such.  Had some fatigue and was asking about check testosterone.  He's also due for CPE labs coming up soon.   Meds, vitals, and allergies reviewed.   ROS: See HPI.  Otherwise, noncontributory.  nad ncat Mmm rrr ctab B knees with normal inspection, mild crepitus on rom, ACL/MCL/LCLs intact and no meniscal click.

## 2011-12-23 NOTE — Patient Instructions (Signed)
Get your labs done one morning.  Physical a few days later.   Start back on the glucosamine.   Take care.

## 2011-12-24 ENCOUNTER — Encounter: Payer: Self-pay | Admitting: Family Medicine

## 2011-12-24 DIAGNOSIS — R5383 Other fatigue: Secondary | ICD-10-CM | POA: Insufficient documentation

## 2011-12-24 NOTE — Assessment & Plan Note (Signed)
Continue with current exercise, he has cut out squats and other offending exercises.  Continue prn tramadol.  Doing well with current meds.

## 2011-12-24 NOTE — Assessment & Plan Note (Signed)
Will check T level at CPE lab draw.  He works nights and this could be contributing.

## 2011-12-26 ENCOUNTER — Other Ambulatory Visit: Payer: Managed Care, Other (non HMO)

## 2011-12-29 ENCOUNTER — Telehealth: Payer: Self-pay

## 2011-12-29 NOTE — Telephone Encounter (Signed)
Pt saw Dr Para March on 12/23/11.Pt said he needs note to be out of work 12/28/11. Pt said had pain and burning in both knees yesterday only. Pt can be reached at [phone] when letter ready for pick up. Explained Dr Para March out of office until 10/31/11.Please advise.

## 2011-12-31 NOTE — Telephone Encounter (Signed)
Patient notified and he'll pick up letter.

## 2011-12-31 NOTE — Telephone Encounter (Signed)
Letter done

## 2012-01-01 ENCOUNTER — Other Ambulatory Visit (INDEPENDENT_AMBULATORY_CARE_PROVIDER_SITE_OTHER): Payer: Managed Care, Other (non HMO)

## 2012-01-01 ENCOUNTER — Encounter: Payer: Managed Care, Other (non HMO) | Admitting: Family Medicine

## 2012-01-01 DIAGNOSIS — R5381 Other malaise: Secondary | ICD-10-CM

## 2012-01-01 DIAGNOSIS — R5383 Other fatigue: Secondary | ICD-10-CM

## 2012-01-01 DIAGNOSIS — E7800 Pure hypercholesterolemia, unspecified: Secondary | ICD-10-CM

## 2012-01-01 LAB — COMPREHENSIVE METABOLIC PANEL
Albumin: 3.5 g/dL (ref 3.5–5.2)
BUN: 20 mg/dL (ref 6–23)
Calcium: 8.9 mg/dL (ref 8.4–10.5)
Chloride: 107 mEq/L (ref 96–112)
Creatinine, Ser: 1.3 mg/dL (ref 0.4–1.5)
Glucose, Bld: 94 mg/dL (ref 70–99)
Potassium: 4.1 mEq/L (ref 3.5–5.1)

## 2012-01-01 LAB — LDL CHOLESTEROL, DIRECT: Direct LDL: 148.7 mg/dL

## 2012-01-01 LAB — TESTOSTERONE: Testosterone: 475.33 ng/dL (ref 350.00–890.00)

## 2012-01-01 LAB — LIPID PANEL
Cholesterol: 209 mg/dL — ABNORMAL HIGH (ref 0–200)
HDL: 43.6 mg/dL (ref >39.00)

## 2012-01-08 ENCOUNTER — Encounter: Payer: Self-pay | Admitting: Family Medicine

## 2012-01-08 ENCOUNTER — Ambulatory Visit (INDEPENDENT_AMBULATORY_CARE_PROVIDER_SITE_OTHER): Payer: Managed Care, Other (non HMO) | Admitting: Family Medicine

## 2012-01-08 VITALS — BP 142/92 | HR 60 | Temp 97.5°F | Wt 217.0 lb

## 2012-01-08 DIAGNOSIS — Z0000 Encounter for general adult medical examination without abnormal findings: Secondary | ICD-10-CM | POA: Insufficient documentation

## 2012-01-08 DIAGNOSIS — Z8042 Family history of malignant neoplasm of prostate: Secondary | ICD-10-CM | POA: Insufficient documentation

## 2012-01-08 NOTE — Progress Notes (Signed)
CPE- See plan.  Routine anticipatory guidance given to patient.  See health maintenance. Flu shot prev done.  Tetanus 2009 Colon cancer screening.  Due at 48.   FH prostate cancer.  We talked about options.  PSA deferred today and will likely start checking PSA next year.  No change in stream and no symptoms.  Exercising and has a healthy diet.    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  T level was normal and discussed.   PMH and SH reviewed  Meds, vitals, and allergies reviewed.   ROS: See HPI.  Otherwise negative.    GEN: nad, alert and oriented NECK: supple w/o LA CV: rrr. PULM: ctab, no inc wob ABD: soft, +bs EXT: no edema SKIN: no acute rash

## 2012-01-08 NOTE — Patient Instructions (Addendum)
I would start checking your prostate next year.  Take care.  Keep exercising.  Glad to see you.  I would get a flu shot each fall.

## 2012-01-08 NOTE — Assessment & Plan Note (Addendum)
Routine anticipatory guidance given to patient.  See health maintenance. Flu shot prev done.  Tetanus 2009 Colon cancer screening.  Due at 48.   FH prostate cancer.  We talked about options.  PSA deferred today and will likely start checking PSA next year.  No change in stream and no symptoms.  Exercising and has a healthy diet.

## 2012-03-04 ENCOUNTER — Ambulatory Visit (INDEPENDENT_AMBULATORY_CARE_PROVIDER_SITE_OTHER): Payer: Managed Care, Other (non HMO) | Admitting: Family Medicine

## 2012-03-04 ENCOUNTER — Encounter: Payer: Self-pay | Admitting: Family Medicine

## 2012-03-04 VITALS — BP 122/80 | HR 59 | Temp 97.9°F | Wt 215.0 lb

## 2012-03-04 DIAGNOSIS — M25569 Pain in unspecified knee: Secondary | ICD-10-CM

## 2012-03-04 NOTE — Patient Instructions (Addendum)
Use the straps, limit leg lifts and keep stretching.  If not better, call me and we'll set you up with ortho.  Take care.

## 2012-03-04 NOTE — Progress Notes (Signed)
Out of work Monday due to knee pain. B pain. Keeping him out of exercise.  Limiting activity.  Tramadol is helping less than prev.  Anterior pain worse with squatting.  Has been limiting his weight with BLE exercises.   Meds, vitals, and allergies reviewed.   ROS: See HPI.  Otherwise, noncontributory.  nad B knees with similar exam- normal inspection and not puffy.  ACL MCL LCL feel solid w/o meniscal click.  Patella not ttp but ttp inferior to patella and just medial/lateral to the quad ligament.  No erythema.  Knee rom intact.

## 2012-03-05 NOTE — Assessment & Plan Note (Signed)
Jumpers knee likley, B.  Try straps, continue stretching.  D/w pt. If not improved, then refer to ortho.

## 2012-06-07 ENCOUNTER — Ambulatory Visit (INDEPENDENT_AMBULATORY_CARE_PROVIDER_SITE_OTHER): Payer: Managed Care, Other (non HMO) | Admitting: Family Medicine

## 2012-06-07 ENCOUNTER — Encounter: Payer: Self-pay | Admitting: Family Medicine

## 2012-06-07 VITALS — BP 122/74 | HR 72 | Temp 98.0°F | Wt 216.0 lb

## 2012-06-07 DIAGNOSIS — M25569 Pain in unspecified knee: Secondary | ICD-10-CM

## 2012-06-07 NOTE — Progress Notes (Signed)
He used jumper's straps with some modest relief at work. Knee pain worse recently with weather changes.  Still with anterior knee pain, patella isn't ttp.  Working out at baseline, limiting his weight for legs and quit squatting.  Tramadol makes it some better.    He had to miss work last night due to pain.  He's up at down constantly at work.   We talked about options.  We hadn't imaged him yet.    Meds, vitals, and allergies reviewed.   ROS: See HPI.  Otherwise, noncontributory.  nad B knee exam with tenderness inferior to patella along the quad ligament but ACL/MCL/LCL feel solid.  No meniscal click and no puffiness of knees.

## 2012-06-07 NOTE — Patient Instructions (Addendum)
See Shirlee Limerick about your referral before you leave today. No charge to visit.  Take care.

## 2012-06-08 NOTE — Assessment & Plan Note (Signed)
Continue tramadol and straps.  Note for work given. Refer to ortho.  Defer imaging to ortho- our films wouldn't be weight bearing. He agrees.  No charge.

## 2012-08-19 ENCOUNTER — Ambulatory Visit (INDEPENDENT_AMBULATORY_CARE_PROVIDER_SITE_OTHER): Payer: Managed Care, Other (non HMO) | Admitting: Family Medicine

## 2012-08-19 ENCOUNTER — Encounter: Payer: Self-pay | Admitting: Family Medicine

## 2012-08-19 VITALS — BP 132/80 | HR 66 | Temp 97.5°F | Wt 222.0 lb

## 2012-08-19 DIAGNOSIS — J329 Chronic sinusitis, unspecified: Secondary | ICD-10-CM

## 2012-08-19 MED ORDER — AZITHROMYCIN 250 MG PO TABS: ORAL_TABLET | ORAL | Status: DC

## 2012-08-19 NOTE — Patient Instructions (Addendum)
Start the antibiotics today.   Drink plenty of fluids, take tylenol as needed, this should gradually improve.  Take care.  Let us know if you have other concerns.

## 2012-08-19 NOTE — Progress Notes (Signed)
Sinus congestion and URI sx.  Started last week.  Tender across the nose, his nose was puffy/stuffy.  HA in the meantime.  Doing on for 1 week.  He thought he was getting better and then sx got worse.  Some cough, worse at night. No sputum.  Rhinorrhea.  B temporal HA and ears are painful.  No fevers.  No diarrhea, no vomiting.  Hadn't had a flu shot yet this year.  Mult sick contacts at work and home.  Fatigued.  Left work early on 08/17/12.    Meds, vitals, and allergies reviewed.   ROS: See HPI.  Otherwise, noncontributory.  GEN: nad, alert and oriented HEENT: mucous membranes moist, tm w/o erythema, nasal exam w/o erythema, clear discharge noted,  OP with cobblestoning, B frontal sinuses ttp NECK: supple w/o LA CV: rrr.   PULM: ctab, no inc wob EXT: no edema SKIN: no acute rash

## 2012-08-20 DIAGNOSIS — J329 Chronic sinusitis, unspecified: Secondary | ICD-10-CM | POA: Insufficient documentation

## 2012-08-20 NOTE — Assessment & Plan Note (Signed)
Tender, sx >1 week. Start the zmax today.  Drink plenty of fluids, take tylenol as needed, this should gradually improve.  Nontoxic.  He agrees with plan.

## 2012-08-26 ENCOUNTER — Telehealth: Payer: Self-pay | Admitting: Family Medicine

## 2012-08-26 MED ORDER — AMOXICILLIN-POT CLAVULANATE 875-125 MG PO TABS: 1.0000 | ORAL_TABLET | Freq: Two times a day (BID) | ORAL | Status: DC

## 2012-08-26 NOTE — Telephone Encounter (Signed)
Patient advised.

## 2012-08-26 NOTE — Telephone Encounter (Signed)
Change over to augmentin, rx sent.  Thanks.

## 2012-08-26 NOTE — Telephone Encounter (Signed)
Patient Information:  Caller Name: Mccabe  Phone: [phone]  Patient: Randy King, Randy King  Gender: Male  DOB: 09/28/1965  Age: 47 Years  PCP: Crawford Givens Clelia Croft) [facility])  Office Follow Up:  Does the office need to follow up with this patient?: Yes  Instructions For The Office: Patient requesting repeat antibiotic. Completed Azithromycin.  please contact if appt must be made. Seen in office on 08/19/12  RN Note:  [facility] ([facility] on [facility]). Requesting repeat medication.  Advised i would send request over. Please contact patient if he must be scheduled in office today.  Symptoms  Reason For Call & Symptoms: Seen recently for Heachache and Ear pain  with Dx Sinusitis. Placed on Azithromycin which he completed.  States symptoms are returning . Onset yesterday. Frontal Headache.  Bilateral ear pain .  Nasal drainage clear.   Should he be on antibiotics . Requesting a repeat of medication again?  Cannot miss work  Reviewed Health History In EMR: Yes  Reviewed Medications In EMR: Yes  Reviewed Allergies In EMR: Yes  Reviewed Surgeries / Procedures: No  Date of Onset of Symptoms: 08/19/2012  Treatments Tried: Azithromycin, Ibuprofen, nasal rinse  Treatments Tried Worked: No  Guideline(s) Used:  Sinus Pain and Congestion  Disposition Per Guideline:   See Today in Office  Reason For Disposition Reached:   Earache  Advice Given:  Reassurance:   Sinus congestion is a normal part of a cold.  Usually home treatment with nasal washes can prevent an actual bacterial sinus infection.  Antibiotics are not helpful for the sinus congestion that occurs with colds.  Here is some care advice that should help.  For a Runny Nose With Profuse Discharge:  Nasal mucus and discharge helps to wash viruses and bacteria out of the nose and sinuses.  Blowing the nose is all that is needed.  For a Stuffy Nose - Use Nasal Washes:  Introduction: Saline (salt water) nasal  irrigation (nasal wash) is an effective and simple home remedy for treating stuffy nose and sinus congestion. The nose can be irrigated by pouring, spraying, or squirting salt water into the nose and then letting it run back out.  How it Helps: The salt water rinses out excess mucus, washes out any irritants (dust, allergens) that might be present, and moistens the nasal cavity.  Methods: There are several ways to perform nasal irrigation. You can use a saline nasal spray bottle (available over-the-counter), a rubber ear syringe, a medical syringe without the needle, or a Neti Pot.  Pain and Fever Medicines:  For pain or fever relief, take either acetaminophen or ibuprofen.  They are over-the-counter (OTC) drugs that help treat both fever and pain. You can buy them at the drugstore.  Ibuprofen (e.g., Motrin, Advil):  Take 400 mg (two 200 mg pills) by mouth every 6 hours.  Another choice is to take 600 mg (three 200 mg pills) by mouth every 8 hours.  Hydration:  Drink plenty of liquids (6-8 glasses of water daily). If the air in your home is dry, use a cool mist humidifier  Call Back If:   Severe pain lasts longer than 2 hours after pain medicine  Sinus pain lasts longer than 1 day after starting treatment using nasal washes  Sinus congestion (fullness) lasts longer than 10 days  Fever lasts longer than 3 days  You become worse.  Patient Refused Recommendation:  Patient Requests Prescription  Please contact patient. If he must set  up appt.

## 2012-09-17 ENCOUNTER — Telehealth: Payer: Self-pay | Admitting: Family Medicine

## 2012-09-17 NOTE — Telephone Encounter (Signed)
Would use miralax and or colace if needed for stools.   Prep H o/w as needed.  Thanks.

## 2012-09-17 NOTE — Telephone Encounter (Signed)
Patient advised.

## 2012-09-17 NOTE — Telephone Encounter (Signed)
Patient Information:  Caller Name: Barnabas  Phone: [phone]  Patient: Randy King, Randy King  Gender: Male  DOB: 1966/04/05  Age: 47 Years  PCP: Crawford Givens Clelia Croft) [facility])  Office Follow Up:  Does the office need to follow up with this patient?: No  Instructions For The Office: N/A   Symptoms  Reason For Call & Symptoms: Pt has rectal/hemorroid pain and wants to know what he can use over the counter.  No bleeding or itching.  Mild discomfort.   Pt states he changed vitamins and noticed he had to strain more with bowel movements.   Pt stopped taking the current vitamin and stool are easier to pass.   Reviewed Health History In EMR: No  Reviewed Medications In EMR: No  Reviewed Allergies In EMR: No  Reviewed Surgeries / Procedures: No  Date of Onset of Symptoms: 09/14/2012  Guideline(s) Used:  Rectal Symptoms  Disposition Per Guideline:   Home Care  Reason For Disposition Reached:   Mild rectal pain  Advice Given:  Treatment of Mild Rectal Pain:  Rectal pain and irritation can often be caused by either hemorrhoids or a tiny tear in the rectal opening (anal fissure). Small drops of blood can sometimes be seen on the toilet paper or stool in people with hemorrhoids or rectal irritation from hard bowel movements.  Warm SITZ Bath Twice a Day - Sit in a warm saline bath for 20 minutes bid to cleanse the area and to promote healing. Add 2 ounces (57 grams) of table salt or baking soda to each tub of water. Afterwards, gently pat area dry with unscented toilet paper.  Topical Hydrocortisone Twice a Day - After taking a Sitz bath and drying your rectal area, apply 1% hydrocortisone ointment (OTC) bid to reduce irritation. Hydrocortisone is found in various OTC hemorrhoid medications (e.g., Anusol HC, Preparation H Hydrocortisone, Analpram HC Cream).  Call Back If:  Severe rectal pain or itching  Rectal pain or itching lasts over 3 days  Rectal bleeding (i.e., more than just a few  drops on toilet paper from wiping)  You become worse.

## 2012-10-15 ENCOUNTER — Other Ambulatory Visit: Payer: Self-pay | Admitting: *Deleted

## 2012-10-15 ENCOUNTER — Encounter: Payer: Self-pay | Admitting: Family Medicine

## 2012-10-15 ENCOUNTER — Ambulatory Visit (INDEPENDENT_AMBULATORY_CARE_PROVIDER_SITE_OTHER): Payer: BC Managed Care – PPO | Admitting: Family Medicine

## 2012-10-15 VITALS — BP 120/70 | HR 60 | Temp 98.3°F | Wt 215.0 lb

## 2012-10-15 DIAGNOSIS — J329 Chronic sinusitis, unspecified: Secondary | ICD-10-CM

## 2012-10-15 MED ORDER — AMOXICILLIN-POT CLAVULANATE 875-125 MG PO TABS: 1.0000 | ORAL_TABLET | Freq: Two times a day (BID) | ORAL | Status: DC

## 2012-10-15 NOTE — Progress Notes (Signed)
He has been able to get by with an average of 4 tramadol a day.   His prev illness from early 1/14 resolved.   Possible sick contacts at work.  duration of symptoms: ~2 weeks w/o improvement rhinorrhea:yes congestion:yes ear pain: yes, B sore throat: yes Cough:yes, but slightly better recently Myalgias: mild Taking mucinex HA noted Fatigued recently and he thought he had a swollen gland in his neck on the R side. No sweats.  No known fevers.    ROS: See HPI.  Otherwise negative.    Meds, vitals, and allergies reviewed.   GEN: nad, alert and oriented HEENT: mucous membranes moist, TM w/o erythema, nasal epithelium injected, OP with cobblestoning, max sinuses ttp B NECK: supple w/o LA CV: rrr. PULM: ctab, no inc wob ABD: soft, +bs EXT: no edema

## 2012-10-15 NOTE — Telephone Encounter (Signed)
Faxed refill request.  Last filled:  07/27/12  Please advise.

## 2012-10-15 NOTE — Patient Instructions (Signed)
Schedule a physical with labs ahead of time for anytime after 01/01/13.  Start the antibiotics today and use nasal saline.   This should improve.  Take care.

## 2012-10-16 MED ORDER — TRAMADOL HCL 50 MG PO TABS: 100.0000 mg | ORAL_TABLET | Freq: Two times a day (BID) | ORAL | Status: DC | PRN

## 2012-10-16 NOTE — Telephone Encounter (Signed)
Sent!

## 2012-10-18 NOTE — Assessment & Plan Note (Signed)
Nontoxic, supportive tx and start augmentin today.  He agrees.  F/u prn.

## 2013-01-05 ENCOUNTER — Other Ambulatory Visit: Payer: Self-pay | Admitting: Family Medicine

## 2013-01-06 NOTE — Telephone Encounter (Signed)
Electronic refill request.  Please advise. 

## 2013-01-06 NOTE — Telephone Encounter (Signed)
He should have refills left.  Please clarify with pharmacy.  Thanks .

## 2013-01-07 ENCOUNTER — Other Ambulatory Visit: Payer: Self-pay | Admitting: *Deleted

## 2013-01-07 MED ORDER — TRAMADOL HCL 50 MG PO TABS: 100.0000 mg | ORAL_TABLET | Freq: Two times a day (BID) | ORAL | Status: DC | PRN

## 2013-01-07 NOTE — Telephone Encounter (Signed)
Electronic refill request.  Please advise. 

## 2013-01-07 NOTE — Telephone Encounter (Signed)
Please clarify this.  He should have refills.

## 2013-01-07 NOTE — Telephone Encounter (Signed)
I didn't catch that.  Thanks.  Sent.

## 2013-01-07 NOTE — Telephone Encounter (Signed)
See prev request.  Thanks. He should have refills.

## 2013-01-07 NOTE — Telephone Encounter (Signed)
It looks like the Rx on 10/15/12 went to [facility] [address].  [facility] [place].

## 2013-03-03 ENCOUNTER — Other Ambulatory Visit: Payer: Self-pay | Admitting: Family Medicine

## 2013-03-03 ENCOUNTER — Other Ambulatory Visit (INDEPENDENT_AMBULATORY_CARE_PROVIDER_SITE_OTHER): Payer: BC Managed Care – PPO

## 2013-03-03 ENCOUNTER — Encounter: Payer: Self-pay | Admitting: *Deleted

## 2013-03-03 DIAGNOSIS — E7800 Pure hypercholesterolemia, unspecified: Secondary | ICD-10-CM

## 2013-03-03 DIAGNOSIS — Z125 Encounter for screening for malignant neoplasm of prostate: Secondary | ICD-10-CM

## 2013-03-03 LAB — COMPREHENSIVE METABOLIC PANEL
ALT: 28 U/L (ref 0–53)
CO2: 26 mEq/L (ref 19–32)
Creatinine, Ser: 1.6 mg/dL — ABNORMAL HIGH (ref 0.4–1.5)
GFR: 61.7 mL/min (ref >60.00)
Total Bilirubin: 0.6 mg/dL (ref 0.3–1.2)

## 2013-03-03 LAB — LIPID PANEL
HDL: 41.7 mg/dL (ref >39.00)
VLDL: 23.4 mg/dL (ref 0.0–40.0)

## 2013-03-03 LAB — LDL CHOLESTEROL, DIRECT: Direct LDL: 187 mg/dL

## 2013-03-21 ENCOUNTER — Encounter: Payer: Self-pay | Admitting: Family Medicine

## 2013-03-21 ENCOUNTER — Ambulatory Visit (INDEPENDENT_AMBULATORY_CARE_PROVIDER_SITE_OTHER): Payer: BC Managed Care – PPO | Admitting: Family Medicine

## 2013-03-21 VITALS — BP 134/86 | HR 70 | Temp 98.0°F | Wt 214.0 lb

## 2013-03-21 DIAGNOSIS — H6983 Other specified disorders of Eustachian tube, bilateral: Secondary | ICD-10-CM

## 2013-03-21 DIAGNOSIS — H6980 Other specified disorders of Eustachian tube, unspecified ear: Secondary | ICD-10-CM

## 2013-03-21 MED ORDER — FLUTICASONE PROPIONATE 50 MCG/ACT NA SUSP: 2.0000 | Freq: Every day | NASAL | Status: DC

## 2013-03-21 NOTE — Assessment & Plan Note (Signed)
Likely viral vs seasonal allergy.  Would use nasal saline for now, then flonase if needed.  Nontoxic. No need for abx.  Dx and anatomy d/w pt.  He agrees. F/u prn.

## 2013-03-21 NOTE — Progress Notes (Signed)
B ear sx for about 2 weeks. Some ST noted- burning sensation.  Clear rhinorrhea, mild stuffiness, minimal cough.  No sneezing. Dry cough.  No other sx.  No FCNAVD.  Ear pain is progressive over the last 2 weeks, ST is intermittent.  He took some ibuprofen with some relief.  No trigger known for his sx. Some sick contacts at work.   He needs help with referral to ortho and he'll ask Shirlee Limerick about this on the way out today.   Meds, vitals, and allergies reviewed.   ROS: See HPI.  Otherwise, noncontributory.  GEN: nad, alert and oriented HEENT: mucous membranes moist, tm w/o erythema, nasal exam w/o erythema, clear discharge noted,  OP with cobblestoning, unable to clear TMs w/valsalva NECK: supple w/o LA CV: rrr.   PULM: ctab, no inc wob EXT: no edema SKIN: no acute rash

## 2013-03-21 NOTE — Patient Instructions (Addendum)
See Randy King about your referral before you leave today (if you need a new referral, I can put one in- have Randy King ask me about this). Use nasal saline and then try the flonase if you don't improve.  Take care.

## 2013-05-08 ENCOUNTER — Other Ambulatory Visit: Payer: Self-pay | Admitting: Family Medicine

## 2013-05-09 NOTE — Telephone Encounter (Signed)
Medication phoned to pharmacy.  

## 2013-05-24 ENCOUNTER — Encounter: Payer: BC Managed Care – PPO | Admitting: Family Medicine

## 2013-06-27 ENCOUNTER — Ambulatory Visit (INDEPENDENT_AMBULATORY_CARE_PROVIDER_SITE_OTHER): Payer: BC Managed Care – PPO | Admitting: Family Medicine

## 2013-06-27 ENCOUNTER — Encounter: Payer: Self-pay | Admitting: Family Medicine

## 2013-06-27 VITALS — BP 122/84 | HR 80 | Temp 98.3°F | Wt 216.2 lb

## 2013-06-27 DIAGNOSIS — J309 Allergic rhinitis, unspecified: Secondary | ICD-10-CM | POA: Insufficient documentation

## 2013-06-27 MED ORDER — LORATADINE 10 MG PO TABS: 10.0000 mg | ORAL_TABLET | Freq: Every day | ORAL | Status: DC | PRN

## 2013-06-27 NOTE — Patient Instructions (Signed)
Keep using the flonase and add on loratidine (OTC) if needed.  Take care.

## 2013-06-27 NOTE — Progress Notes (Signed)
Pre-visit discussion using our clinic review tool. No additional management support is needed unless otherwise documented below in the visit note.  duration of symptoms: going on for about 1 week rhinorrhea:yes congestion:yes ear pain: some mild ear ache B sore throat:yes Cough: minimal Myalgias: some, from working out.  Fevers: no other concerns:ears ringing No rash.   Using flonase, with some relief.  He feels more open and less stuffy with that.   Not on an antihistamine.   ROS: See HPI.  Otherwise negative.    Meds, vitals, and allergies reviewed.   GEN: nad, alert and oriented HEENT: mucous membranes moist, TM w/o erythema, nasal epithelium minimally injected, OP with minimal cobblestoning, R TM initially obscured by cerumen but resolved with irrigation, no complication and tolerated well.  NECK: supple w/o LA CV: rrr. PULM: ctab, no inc wob ABD: soft, +bs EXT: no edema

## 2013-06-27 NOTE — Assessment & Plan Note (Signed)
Continue flonase and add on otc antihistamine prn.  Cerumen impaction resolved.  Fu prn.  No indication for abx.  Nontoxic.

## 2013-07-31 ENCOUNTER — Other Ambulatory Visit: Payer: Self-pay | Admitting: Family Medicine

## 2013-08-01 NOTE — Telephone Encounter (Signed)
Electronic refill request, please advise  

## 2013-08-01 NOTE — Telephone Encounter (Signed)
Denied.  Should already have a refill.  He should check with pharmacy.

## 2013-08-02 NOTE — Telephone Encounter (Signed)
Left message on cell phone to call back

## 2013-08-02 NOTE — Telephone Encounter (Signed)
Patient notified by telephone. Patient stated that he will check with the pharmacy.

## 2013-11-22 ENCOUNTER — Other Ambulatory Visit: Payer: Self-pay | Admitting: Family Medicine

## 2013-11-22 NOTE — Telephone Encounter (Signed)
Please call in

## 2013-11-22 NOTE — Telephone Encounter (Signed)
Medication phoned to pharmacy.  

## 2013-11-22 NOTE — Telephone Encounter (Signed)
Electronic refill request. Last filled:  360 tablet 1RF 05/08/2013

## 2013-12-07 ENCOUNTER — Ambulatory Visit (INDEPENDENT_AMBULATORY_CARE_PROVIDER_SITE_OTHER): Payer: BC Managed Care – PPO | Admitting: Family Medicine

## 2013-12-07 ENCOUNTER — Ambulatory Visit (INDEPENDENT_AMBULATORY_CARE_PROVIDER_SITE_OTHER)
Admission: RE | Admit: 2013-12-07 | Discharge: 2013-12-07 | Disposition: A | Discharge status: Home / Self Care | Payer: BC Managed Care – PPO | Source: Ambulatory Visit | Attending: Family Medicine | Admitting: Family Medicine

## 2013-12-07 ENCOUNTER — Encounter: Payer: Self-pay | Admitting: Family Medicine

## 2013-12-07 VITALS — BP 128/78 | HR 64 | Temp 97.8°F | Wt 218.5 lb

## 2013-12-07 DIAGNOSIS — J029 Acute pharyngitis, unspecified: Secondary | ICD-10-CM

## 2013-12-07 DIAGNOSIS — J069 Acute upper respiratory infection, unspecified: Secondary | ICD-10-CM

## 2013-12-07 DIAGNOSIS — M25569 Pain in unspecified knee: Secondary | ICD-10-CM

## 2013-12-07 LAB — POCT RAPID STREP A (OFFICE): Rapid Strep A Screen: NEGATIVE

## 2013-12-07 NOTE — Patient Instructions (Signed)
Go to the lab on the way out.  We'll contact you with your xray report. We'll go from there.  Drink plenty of fluids and gargle with warm salt water for your throat.  This should gradually improve.  Take care.  Let us know if you have other concerns.

## 2013-12-07 NOTE — Progress Notes (Signed)
Pre visit review using our clinic review tool, if applicable. No additional management support is needed unless otherwise documented below in the visit note.  He had seen ortho last year.  Pt didn't want to go through PT.  Still lifting, not going heavy and limiting knee work.  More cardio, elliptical.  No squats.  Knees not popping or clicking but diffuse pain is worsening.  Taking tramadol 4 a day.  Knee pain is worse recently.  No trauma.    Recently with URI sx/ST.  duration of symptoms: 2-3 days.  rhinorrhea:no congestion:yes ear pain:no sore throat: yes, on the L side.  cough:no myalgias:no other concerns: ibuprofen and gargling helped some.  He had some pain with swallowing.    ROS: See HPI.  Otherwise negative.    Meds, vitals, and allergies reviewed.   GEN: nad, alert and oriented HEENT: mucous membranes moist, TM w/o erythema, nasal epithelium injected, OP with cobblestoning NECK: supple w/o LA CV: rrr. PULM: ctab, no inc wob ABD: soft, +bs EXT: no edema B patellar click but no meniscal click. [facility] feel solid B B ttp just inferior medially and laterally to the patella Normal ROM B  RST neg.

## 2013-12-08 DIAGNOSIS — J069 Acute upper respiratory infection, unspecified: Secondary | ICD-10-CM | POA: Insufficient documentation

## 2013-12-08 NOTE — Assessment & Plan Note (Signed)
Likely viral, should resolve.

## 2013-12-08 NOTE — Assessment & Plan Note (Signed)
Continue tramadol. See notes on films.  Would offer second opinion from ortho.

## 2013-12-19 ENCOUNTER — Ambulatory Visit: Payer: Self-pay | Admitting: Podiatry

## 2014-02-20 ENCOUNTER — Ambulatory Visit (INDEPENDENT_AMBULATORY_CARE_PROVIDER_SITE_OTHER): Payer: BC Managed Care – PPO | Admitting: Podiatry

## 2014-02-20 ENCOUNTER — Ambulatory Visit (INDEPENDENT_AMBULATORY_CARE_PROVIDER_SITE_OTHER): Payer: BC Managed Care – PPO

## 2014-02-20 ENCOUNTER — Encounter: Payer: Self-pay | Admitting: Podiatry

## 2014-02-20 VITALS — BP 130/75 | HR 64 | Resp 16 | Ht 72.0 in | Wt 210.0 lb

## 2014-02-20 DIAGNOSIS — M722 Plantar fascial fibromatosis: Secondary | ICD-10-CM

## 2014-02-20 MED ORDER — METHYLPREDNISOLONE (PAK) 4 MG PO TABS: ORAL_TABLET | ORAL | Status: DC

## 2014-02-20 MED ORDER — OXAPROZIN 600 MG PO TABS: 1200.0000 mg | ORAL_TABLET | Freq: Every day | ORAL | Status: DC

## 2014-02-20 NOTE — Progress Notes (Signed)
   Subjective:    Patient ID: Marvis Moelleraron Mapes, male    DOB: Jun 19, 1966, 48 y.o.   MRN: [id number]  HPI Comments: i have pain in my arches and under the toes in both feet. i have had this for 1.5 months. It got better and it came back. Its getting worse. It hurts to walk at times. i use icy hot on my feet and take ibuprofen.  Foot Pain      Review of Systems     Objective:   Physical Exam: I have reviewed his past medical history medications allergies surgeries social history and review of systems. Pulses are palpable bilateral. Degenerative flexor intact bilateral. Neurologic sensorium is intact per Semmes-Weinstein monofilament. He tendon reflexes are intact bilateral muscle strength is 5 over 5 dorsiflexors plantar flexors inverters everters all intrinsic musculature is intact. Orthopedic evaluation demonstrates all joints distal to the ankle a full range of motion without crepitation. He does have pain on palpation medial continued tubercles bilateral. Radiographic evaluation demonstrates no major osseous abnormalities.  Assessment: Plantar fasciitis bilateral.  Plan: Discussed etiology pathology conservative versus surgical therapies. Started him a Medrol Dosepak to be followed by Daypro 1200 mg daily. I injected the bilateral heels. Put him in a plantar fascial brace bilateral. Discussed appropriate shoe gear stretching exercises ice therapy shoe gear modifications and the purchasing of new shoes. I will followup with him once his orthotics committed.        Assessment & Plan:

## 2014-03-20 ENCOUNTER — Ambulatory Visit: Payer: BC Managed Care – PPO | Admitting: Podiatry

## 2014-03-31 ENCOUNTER — Ambulatory Visit (INDEPENDENT_AMBULATORY_CARE_PROVIDER_SITE_OTHER): Payer: BC Managed Care – PPO | Admitting: Family Medicine

## 2014-03-31 ENCOUNTER — Encounter: Payer: Self-pay | Admitting: Family Medicine

## 2014-03-31 VITALS — BP 128/80 | HR 67 | Temp 97.8°F | Wt 218.5 lb

## 2014-03-31 DIAGNOSIS — J069 Acute upper respiratory infection, unspecified: Secondary | ICD-10-CM

## 2014-03-31 DIAGNOSIS — M25569 Pain in unspecified knee: Secondary | ICD-10-CM

## 2014-03-31 DIAGNOSIS — M25561 Pain in right knee: Secondary | ICD-10-CM

## 2014-03-31 DIAGNOSIS — M25562 Pain in left knee: Secondary | ICD-10-CM

## 2014-03-31 NOTE — Progress Notes (Signed)
Pre visit review using our clinic review tool, if applicable. No additional management support is needed unless otherwise documented below in the visit note.  He has had more knee pain and I asked him to get set up with Dr. Patsy Lageropland.  He'll work on that.    Cold sx for about 1 week. Started last Friday, fatigue, then ST, HA, cough.  No fevers.  Stuffy.  Taking alka seltzer plus, w/o help. No ear pain now, prev with some R ear pain. No sputum. No rash, vomiting, diarrhea.  Overall, he isn't better or worse.  HA is better now, it has been intermittent.    Meds, vitals, and allergies reviewed.   ROS: See HPI.  Otherwise, noncontributory.  GEN: nad, alert and oriented HEENT: mucous membranes moist, tm w/o erythema- some wax in R canal, nasal exam w/o erythema, clear discharge noted,  OP with cobblestoning and viral changes on the soft palate NECK: supple w/o LA CV: rrr.   PULM: ctab, no inc wob EXT: no edema SKIN: no acute rash

## 2014-03-31 NOTE — Patient Instructions (Addendum)
Ask about seeing Dr. Patsy Lageropland for your knee pain.  Schedule a physical when convenient.   Drink plenty of fluids, take tylenol as needed, and gargle with warm salt water for your throat.  This should gradually improve.  Take care.  Let us know if you have other concerns.

## 2014-04-02 NOTE — Assessment & Plan Note (Signed)
He has had more knee pain and I asked him to get set up with Dr. Patsy Lageropland. He'll work on that.  He may be a candidate for injection.  Had used tramadol in the meantime.

## 2014-04-02 NOTE — Assessment & Plan Note (Signed)
Likely viral, f/u prn.  Supportive care.  Nontoxic. D/w pt.

## 2014-04-03 ENCOUNTER — Encounter: Payer: Self-pay | Admitting: *Deleted

## 2014-04-17 ENCOUNTER — Ambulatory Visit (INDEPENDENT_AMBULATORY_CARE_PROVIDER_SITE_OTHER): Payer: BC Managed Care – PPO | Admitting: Family Medicine

## 2014-04-17 VITALS — BP 122/80 | HR 62 | Temp 98.1°F | Ht 72.0 in | Wt 216.2 lb

## 2014-04-17 DIAGNOSIS — M6751 Plica syndrome, right knee: Secondary | ICD-10-CM

## 2014-04-17 DIAGNOSIS — M170 Bilateral primary osteoarthritis of knee: Secondary | ICD-10-CM

## 2014-04-17 DIAGNOSIS — M6750 Plica syndrome, unspecified knee: Secondary | ICD-10-CM

## 2014-04-17 DIAGNOSIS — M1710 Unilateral primary osteoarthritis, unspecified knee: Secondary | ICD-10-CM

## 2014-04-17 NOTE — Progress Notes (Signed)
Dr. Karleen Hampshire T. Toben Acuna, MD, CAQ Sports Medicine [facility] [address] [place], [address] Phone: [phone] Fax: [phone]  04/17/2014  Patient: Randy King, MRN: [id number], DOB: 06/07/1966, 48 y.o.  Primary Physician:  Crawford Givens, MD  Chief Complaint: Knee Pain  Subjective:   Dear Dr. Para March,  Thank you for having me see Randy King in consultation today at [facility] at [facility] for his problem with B knee pain.  As you may recall, he is a 48 y.o. year old male with a history of B knee pain that does not involve any specific injury that appears to have been ongoing for years. He is physically active, he is a very active job at [employer] for work. He is pulling things and lifting things continually. He also still is able to work out, knee works out on a treadmill, and he does do some light leg weights, but he limits his squatting or his lunges that he does, whichinjure it hurt his knee  He also takes multiple and inflammatories a day, and he also takes tramadol p.r.n. For knee pain. As there had a prior operative intervention on the knee. He does have some occasional buckling and giving way. He does not have any kind of significant effusion routinely.  Will do the treadmill, will work out and some leg curls. No squats.  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   Past Medical History  Diagnosis Date  . Elevated serum creatinine     history of, likely related to muscle mass and not true renal disease  . Childhood asthma     no symptoms as adult.  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 Hyperlipidemia   . Back pain     frequent  . Knee pain     frequent   Past Surgical History  Procedure Laterality Date  . Lipoma excision  12/29/08    right posterior neck, Dr. Daphine Deutscher   History   Social History  . Marital Status: Married    Spouse Name: N/A    Number of Children: 2  . Years of Education: N/A   Occupational  History  . [employer], Museum/gallery exhibitions officer    Social History Main Topics  . Smoking status: Never Smoker   . Smokeless tobacco: Never Used  . Alcohol Use: Yes     Comment: rare  . Drug Use: No  . Sexual Activity: Not on file   Other Topics Concern  . Not on file   Social History Narrative   Lives with wife, has 2 daughters   From [place], in [place] since 2004   Exercise- cardio and weights.   Enjoys watching bull riding.   Family History  Problem Relation Age of Onset  . Diabetes Mother   . Cancer Father     prostate, dx'd in his 52's  . Prostate cancer Father   . Alcohol abuse Maternal Uncle   . Stroke Neg Hx   . Depression Neg Hx   . Drug abuse Neg Hx   . Colon cancer Neg Hx    Medications and Allergies reviewed  GEN: No fevers, chills. Nontoxic. Primarily MSK c/o today. MSK: Detailed in the HPI GI: tolerating PO intake without difficulty Neuro: No numbness, parasthesias, or tingling associated. Otherwise the pertinent positives of the ROS are noted above.   Objective:   BP 122/80  Pulse 62  Temp(Src) 98.1 F (36.7 C) (Oral)  Ht 6' (1.829 m)  Wt 216 lb 4 oz (98.09 kg)  BMI 29.32 kg/m2    GEN: WDWN, NAD, Non-toxic, Alert & Oriented x 3 HEENT: Atraumatic, Normocephalic.  Ears and Nose: No external deformity. EXTR: No clubbing/cyanosis/edema NEURO: Normal gait.  PSYCH: Normally interactive. Conversant. Not depressed or anxious appearing.  Calm demeanor.   Knee:  B Gait: Normal heel toe pattern ROM: 0-125 Effusion: neg Echymosis or edema: none Patellar tendon NT Painful PLICA: Medially b Patellar grind: negative Medial and lateral patellar facet loading: negative medial and lateral joint lines: mild medial joint line pain Mcmurray's neg Flexion-pinch neg Varus and valgus stress: stable Lachman: neg Ant and Post drawer: neg Hip abduction, IR, ER: WNL Hip flexion str: 5/5 Hip abd: 5/5 Quad: 5/5 VMO atrophy:No Hamstring  concentric and eccentric: 5/5   Radiology: EXAM: DG KNEE - 3 VIEWS   COMPARISON:  None.   FINDINGS: Tricompartment degenerative change noted. Tiny bony density noted inferior an medial to the patella. Tiny loose body cannot be excluded. No evidence of knee joint effusion. Soft tissue structures are unremarkable. No acute bony abnormality.   IMPRESSION: Tricompartment degenerative change. Tiny bony density noted along the inferior medial aspect of the patella, tiny loose body cannot be excluded .     Electronically Signed   By: Maisie Fus  Register   On: 12/07/2013 16:25  EXAM: DG KNEE - 3 VIEWS   COMPARISON:  None.   FINDINGS: Tricompartment degenerative changes noted. Benign-appearing soft tissue ossification noted posterior to the fibula. No acute bony or joint abnormality.   IMPRESSION: Mild tricompartment degenerative change.     Electronically Signed   By: Maisie Fus  Register   On: 12/07/2013 16:26  The radiological images were independently reviewed by myself in the office and results were reviewed with the patient. My independent interpretation of images:  Appear to be minimal mild osteoarthritic changes in the knee and multiple compartments. These areagain multiple areas, but quite mild in nature. The RIGHT compartment, particularly medially is worse compared to the LEFT. There is no evidence for fracture or dislocation. Electronically Signed  By: Hannah Beat, MD On: 04/18/2014 3:43 AM   Assessment and Plan:   Primary osteoarthritis of both knees  Plica syndrome of knee, right   I think this is primary OA. Plica syndrome may be causing significant problems, too. Cont NSAIDS, add Tylenol, cont tramadol.   Diagnostic and therapeutic injections of knee. If helpful, would point to more pure OA source of pain.  Knee Injection, RIGHT Patient verbally consented to procedure. Risks (including potential rare risk of infection), benefits, and alternatives explained.  Sterilely prepped with Chloraprep. Ethyl cholride used for anesthesia. 8 cc Lidocaine 1% mixed with Depo-Medrol 80 mg injected using the anteromedial approach without difficulty. No complications with procedure and tolerated well. Patient had decreased pain post-injection.   Knee Injection, LEFT Patient verbally consented to procedure. Risks (including potential rare risk of infection), benefits, and alternatives explained. Sterilely prepped with Chloraprep. Ethyl cholride used for anesthesia. 8 cc Lidocaine 1% mixed with Depo-Medrol 80 mg injected using the anteromedial approach without difficulty. No complications with procedure and tolerated well. Patient had decreased pain post-injection.   F/u prn if not improving at all. If better, then none needed routinely.  I appreciate the opportunity to evaluate this very friendly patient. If you have any question regarding her care or prognosis, do not hesitate to ask.   Signed,  Elpidio Galea. Lyan Holck, MD   Patient's Medications  New Prescriptions  No medications on file  Previous Medications   MULTIPLE VITAMIN (MULTIVITAMIN) TABLET    Take 1 tablet by mouth daily.     TRAMADOL (ULTRAM) 50 MG TABLET    TAKE 2 TABLETS BY MOUTH TWICE A DAY AS NEEDED  Modified Medications   No medications on file  Discontinued Medications   No medications on file

## 2014-04-17 NOTE — Progress Notes (Signed)
Pre visit review using our clinic review tool, if applicable. No additional management support is needed unless otherwise documented below in the visit note. 

## 2014-04-18 ENCOUNTER — Encounter: Payer: Self-pay | Admitting: Family Medicine

## 2014-05-02 ENCOUNTER — Ambulatory Visit (INDEPENDENT_AMBULATORY_CARE_PROVIDER_SITE_OTHER): Payer: BC Managed Care – PPO | Admitting: Family Medicine

## 2014-05-02 ENCOUNTER — Encounter: Payer: Self-pay | Admitting: Family Medicine

## 2014-05-02 VITALS — BP 126/86 | HR 67 | Temp 97.9°F | Wt 219.5 lb

## 2014-05-02 DIAGNOSIS — J069 Acute upper respiratory infection, unspecified: Secondary | ICD-10-CM

## 2014-05-02 MED ORDER — AZITHROMYCIN 250 MG PO TABS: ORAL_TABLET | ORAL | Status: DC

## 2014-05-02 NOTE — Progress Notes (Signed)
Pre visit review using our clinic review tool, if applicable. No additional management support is needed unless otherwise documented below in the visit note.  He had limited relief from knee injection.    Recently sick, last few weeks, seen 03/31/14, has been sick in the interval.  Voice is altered.  Cough. ST.  No better in the meantime.  Taking dayquil, tylenol w/o help.  No fevers.  Variably stuffy.  Using nasal saline.  No facial pain.    Meds, vitals, and allergies reviewed.   ROS: See HPI.  Otherwise, noncontributory.  nad Tm wnl, nasal exam stuffy Op irritated, likely from cough.  No exudates Neck supple, no LA rrr ctab abd soft Ext w/o edema

## 2014-05-02 NOTE — Patient Instructions (Addendum)
If you keep having knee trouble, please notify Dr. Patsy Lager.  Take the antibiotics and notify me next week if not better.

## 2014-05-03 NOTE — Assessment & Plan Note (Signed)
Would treat given the duration.  Nontoxic.  Fu prn.  Supportive care.  He agrees.

## 2014-05-10 ENCOUNTER — Telehealth: Payer: Self-pay

## 2014-05-10 NOTE — Telephone Encounter (Signed)
I would give this a few more days. The zmax lasts about 10 days and he may still improve some.  Thanks.

## 2014-05-10 NOTE — Telephone Encounter (Signed)
Pt was seen on 05/02/14; pt finished Z pak, pt still non prod cough; pt taking OTC cough med and helps for 2 hours then cough returns, No fever,S/T,wheezing. Occasional SOB on and off; no particular time of day or reason for SOB such as exertion. Pt wanted to know if needed more antibiotic. [facility]. Pt request cb.

## 2014-05-10 NOTE — Telephone Encounter (Signed)
Left message on voice mail  to call back

## 2014-05-11 NOTE — Telephone Encounter (Signed)
Left detailed message on machine with results, advised pt to call if not any better.

## 2014-05-15 ENCOUNTER — Encounter: Payer: Self-pay | Admitting: Family Medicine

## 2014-05-15 ENCOUNTER — Ambulatory Visit (INDEPENDENT_AMBULATORY_CARE_PROVIDER_SITE_OTHER): Payer: BC Managed Care – PPO | Admitting: Family Medicine

## 2014-05-15 VITALS — BP 118/76 | HR 64 | Temp 98.2°F | Ht 70.0 in | Wt 213.8 lb

## 2014-05-15 DIAGNOSIS — E7800 Pure hypercholesterolemia, unspecified: Secondary | ICD-10-CM

## 2014-05-15 DIAGNOSIS — R059 Cough, unspecified: Secondary | ICD-10-CM

## 2014-05-15 DIAGNOSIS — M25561 Pain in right knee: Secondary | ICD-10-CM

## 2014-05-15 DIAGNOSIS — Z125 Encounter for screening for malignant neoplasm of prostate: Secondary | ICD-10-CM

## 2014-05-15 DIAGNOSIS — M25562 Pain in left knee: Secondary | ICD-10-CM

## 2014-05-15 DIAGNOSIS — Z7189 Other specified counseling: Secondary | ICD-10-CM

## 2014-05-15 DIAGNOSIS — R05 Cough: Secondary | ICD-10-CM

## 2014-05-15 DIAGNOSIS — Z0000 Encounter for general adult medical examination without abnormal findings: Secondary | ICD-10-CM

## 2014-05-15 LAB — COMPREHENSIVE METABOLIC PANEL
ALK PHOS: 33 U/L — AB (ref 39–117)
ALT: 32 U/L (ref 0–53)
AST: 29 U/L (ref 0–37)
Albumin: 3.8 g/dL (ref 3.5–5.2)
BILIRUBIN TOTAL: 0.7 mg/dL (ref 0.2–1.2)
BUN: 16 mg/dL (ref 6–23)
CO2: 25 mEq/L (ref 19–32)
Calcium: 9.1 mg/dL (ref 8.4–10.5)
Chloride: 104 mEq/L (ref 96–112)
Creatinine, Ser: 1.4 mg/dL (ref 0.4–1.5)
GFR: 71.39 mL/min (ref >60.00)
Glucose, Bld: 94 mg/dL (ref 70–99)
POTASSIUM: 4.4 meq/L (ref 3.5–5.1)
Sodium: 135 mEq/L (ref 135–145)
Total Protein: 6.6 g/dL (ref 6.0–8.3)

## 2014-05-15 LAB — PSA: PSA: 0.65 ng/mL (ref 0.10–4.00)

## 2014-05-15 LAB — LIPID PANEL
CHOL/HDL RATIO: 6
Cholesterol: 265 mg/dL — ABNORMAL HIGH (ref 0–200)
HDL: 43.8 mg/dL (ref >39.00)
LDL Cholesterol: 197 mg/dL — ABNORMAL HIGH (ref 0–99)
NonHDL: 221.2
Triglycerides: 123 mg/dL (ref 0.0–149.0)
VLDL: 24.6 mg/dL (ref 0.0–40.0)

## 2014-05-15 MED ORDER — ALBUTEROL SULFATE HFA 108 (90 BASE) MCG/ACT IN AERS: 2.0000 | INHALATION_SPRAY | Freq: Four times a day (QID) | RESPIRATORY_TRACT | Status: DC | PRN

## 2014-05-15 NOTE — Patient Instructions (Signed)
Go to the lab on the way out.  We'll contact you with your lab report. Use the inhaler and notify me if not better.   Take care.  Glad to see you.

## 2014-05-15 NOTE — Progress Notes (Signed)
Pre visit review using our clinic review tool, if applicable. No additional management support is needed unless otherwise documented below in the visit note.  CPE- See plan.  Routine anticipatory guidance given to patient.  See health maintenance. Tetanus 2009 Flu shot due, d/w pt.   Deferred for now due to cough.  PNA and shingles shot not due. D/w pt.  D/w patient [id number] for colon cancer screening, including IFOB vs. colonoscopy.  Risks and benefits of both were discussed and patient voiced understanding.  Pt elects to consider IFOB vs colonoscopy.  Prostate cancer screening and PSA options (with potential risks and benefits of testing vs not testing) were discussed along with recent recs/guidelines.  He wanted testing PSA at this point. FH noted.  Living will d/w pt.  Wife designated if patient were incapacitated.    H/o HLD noted.  Not on meds. Due for labs.  Healthy diet and good exercise routine.   His knee pain is better now and it may be that he had a delayed onset of relief from injection.   Cough is not resolved.  Not worse, but not much better.  Prev treated with abx.  No sputum. No fevers.  No wheeze usually.  Not on SABA.  H/o asthma noted, childhood.   PMH and SH reviewed  Meds, vitals, and allergies reviewed.   ROS: See HPI.  Otherwise negative.    GEN: nad, alert and oriented HEENT: mucous membranes moist NECK: supple w/o LA CV: rrr. PULM: ctab, no inc wob, dry cough noted. No wheeze.  ABD: soft, +bs EXT: no edema SKIN: no acute rash

## 2014-05-16 DIAGNOSIS — R059 Cough, unspecified: Secondary | ICD-10-CM | POA: Insufficient documentation

## 2014-05-16 DIAGNOSIS — R051 Acute cough: Secondary | ICD-10-CM | POA: Insufficient documentation

## 2014-05-16 DIAGNOSIS — Z7189 Other specified counseling: Secondary | ICD-10-CM | POA: Insufficient documentation

## 2014-05-16 DIAGNOSIS — R05 Cough: Secondary | ICD-10-CM | POA: Insufficient documentation

## 2014-05-16 NOTE — Assessment & Plan Note (Signed)
This could be an asthma variant.  He'll try prn SABA and notify me.  Okay for outpatient f/u.

## 2014-05-16 NOTE — Assessment & Plan Note (Signed)
See notes on labs. 

## 2014-05-16 NOTE — Assessment & Plan Note (Signed)
Now improved. Continue prn tramadol.

## 2014-05-16 NOTE — Assessment & Plan Note (Signed)
Routine anticipatory guidance given to patient.  See health maintenance. Tetanus 2009 Flu shot due, d/w pt.   Deferred for now due to cough.  PNA and shingles shot not due. D/w pt.  D/w patient [id number]:options for colon cancer screening, including IFOB vs. colonoscopy.  Risks and benefits of both were discussed and patient voiced understanding.  Pt elects to consider IFOB vs colonoscopy.  Prostate cancer screening and PSA options (with potential risks and benefits of testing vs not testing) were discussed along with recent recs/guidelines.  He wanted testing PSA at this point. FH noted.  Living will d/w pt.  Wife designated if patient were incapacitated.

## 2014-05-26 ENCOUNTER — Other Ambulatory Visit: Payer: Self-pay | Admitting: Family Medicine

## 2014-05-29 NOTE — Telephone Encounter (Signed)
Please call in.  Thanks.   

## 2014-05-29 NOTE — Telephone Encounter (Signed)
Electronic Rx request for Tramadol received. Patient last office visit 05/15/14 and medication last filled 11/22/13 with 1 refill. Please advise.

## 2014-05-30 NOTE — Telephone Encounter (Signed)
Rx called to pharmacy

## 2014-08-28 ENCOUNTER — Encounter: Payer: Self-pay | Admitting: Family Medicine

## 2014-08-28 ENCOUNTER — Ambulatory Visit (INDEPENDENT_AMBULATORY_CARE_PROVIDER_SITE_OTHER): Payer: BLUE CROSS/BLUE SHIELD | Admitting: Family Medicine

## 2014-08-28 VITALS — BP 130/76 | HR 73 | Temp 97.5°F | Wt 219.5 lb

## 2014-08-28 DIAGNOSIS — M545 Low back pain, unspecified: Secondary | ICD-10-CM

## 2014-08-28 MED ORDER — CYCLOBENZAPRINE HCL 10 MG PO TABS: 5.0000 mg | ORAL_TABLET | Freq: Three times a day (TID) | ORAL | Status: DC | PRN

## 2014-08-28 NOTE — Patient Instructions (Signed)
Stretch, heat, flexeril- sedation caution.  Take care.

## 2014-08-28 NOTE — Progress Notes (Signed)
Pre visit review using our clinic review tool, if applicable. No additional management support is needed unless otherwise documented below in the visit note.  More lower back pain recently.  Taking tramadol with some extra ibuprofen.  Worse in the last week.  He had some pain at baseline, but this was more than normal. B lower back pain, just lateral to midline, no radicular component.  No rash.  No trauma. He was working more over the holidays and may need to change shoes.   No bowel or urinary changes.  Used local heat recently.   Out of work 08/25/13. Has been stretching lot recently.  He realizes he needs to stretch frequently.    Meds, vitals, and allergies reviewed.   ROS: See HPI.  Otherwise, noncontributory.  nad rrr ctab abd soft Upper L spine B ttp, but not in the midline.  Distally NV intact Ext w/o edema Gait wnl

## 2014-08-29 DIAGNOSIS — M549 Dorsalgia, unspecified: Secondary | ICD-10-CM | POA: Insufficient documentation

## 2014-08-29 NOTE — Assessment & Plan Note (Signed)
Likely benign MSK strain, stretch, heat, and flexeril with sedation caution.  Fu prn.  He agrees.

## 2014-11-24 ENCOUNTER — Other Ambulatory Visit: Payer: Self-pay | Admitting: Family Medicine

## 2014-11-24 NOTE — Telephone Encounter (Signed)
Pt left v/m; pt going out of town on 11/26/14 and wants to ck status of tramadol request from [facility].

## 2014-11-24 NOTE — Telephone Encounter (Signed)
Last filled 05/2014 #360 with 1 refill--please advise

## 2014-11-24 NOTE — Telephone Encounter (Signed)
Please call in.  We need more time on refills.

## 2014-11-24 NOTE — Telephone Encounter (Signed)
Medication phoned in. He will be able to get the RX until 11/28/14, due to this rx being early.

## 2014-12-05 ENCOUNTER — Encounter: Payer: Self-pay | Admitting: Family Medicine

## 2014-12-05 ENCOUNTER — Ambulatory Visit (INDEPENDENT_AMBULATORY_CARE_PROVIDER_SITE_OTHER): Payer: BLUE CROSS/BLUE SHIELD | Admitting: Family Medicine

## 2014-12-05 VITALS — BP 116/80 | HR 69 | Temp 98.7°F | Wt 216.2 lb

## 2014-12-05 DIAGNOSIS — H578 Other specified disorders of eye and adnexa: Secondary | ICD-10-CM

## 2014-12-05 DIAGNOSIS — H5789 Other specified disorders of eye and adnexa: Secondary | ICD-10-CM

## 2014-12-05 MED ORDER — ERYTHROMYCIN 5 MG/GM OP OINT: 1.0000 "application " | TOPICAL_OINTMENT | Freq: Four times a day (QID) | OPHTHALMIC | Status: DC | PRN

## 2014-12-05 NOTE — Patient Instructions (Signed)
Use the ointment if you have more trouble and then let me know.  Glad to see you.  Take care.

## 2014-12-05 NOTE — Progress Notes (Signed)
Pre visit review using our clinic review tool, if applicable. No additional management support is needed unless otherwise documented below in the visit note.  Woke up a few days ago w/o sx, then later on noted R eye sx.  Red eye.  Irritated.  Quick onset of sx.  Still went to work that night.  Similar sx on 12/03/14.  Not resolved yet but better.  Some discharge, watery.  No L sided sx.  No vision change.  No eye pain now, but had some discomfort prev.  Eye redness was limited to temporal side of eye.   No known FB.  Out of work last night.  No FCNAVD.  No rhinorrhea, no ST.    Meds, vitals, and allergies reviewed.   ROS: See HPI.  Otherwise, noncontributory.  nad ncat Tm wnl Nasal and OP exam wnl PERRL, EOMI, R eye fundus wnl on limited exam.  Eye redness limited to temporal side of eye. No FB seen, no discharge.  No abnormality seen on staining with wood's lamp.

## 2014-12-06 DIAGNOSIS — H5789 Other specified disorders of eye and adnexa: Secondary | ICD-10-CM | POA: Insufficient documentation

## 2014-12-06 NOTE — Assessment & Plan Note (Signed)
Likely not pinkeye.  Likely had a small FB, now not present.  Likely irritated the conjunctival surface, improving already.  If more sx, then use ery ointment and notify me.  He agrees.  Doesn't need treatment now.

## 2014-12-11 ENCOUNTER — Ambulatory Visit (INDEPENDENT_AMBULATORY_CARE_PROVIDER_SITE_OTHER): Payer: BLUE CROSS/BLUE SHIELD | Admitting: Podiatry

## 2014-12-11 ENCOUNTER — Encounter: Payer: Self-pay | Admitting: Podiatry

## 2014-12-11 VITALS — BP 128/79 | HR 62 | Resp 16

## 2014-12-11 DIAGNOSIS — M722 Plantar fascial fibromatosis: Secondary | ICD-10-CM | POA: Diagnosis not present

## 2014-12-11 MED ORDER — MELOXICAM 15 MG PO TABS: 15.0000 mg | ORAL_TABLET | Freq: Every day | ORAL | Status: DC

## 2014-12-11 MED ORDER — METHYLPREDNISOLONE 4 MG PO TBPK: ORAL_TABLET | ORAL | Status: DC

## 2014-12-11 NOTE — Progress Notes (Signed)
He presents today complaining of pain to his bilateral heels. He states nothing maybe minor shoes are getting too well. I asked him if he was to wear his orthotics he states that he was never able to wear them very well. Just doesn't wear them.  Objective: Vital signs are stable he is alert and oriented 3. Pulses are palpable bilateral. He has pain on palpation medial calcaneal tubercle bilateral.  Assessment: Plantar fasciitis bilateral.  Plan: We injected bilateral heels today start him on a Medrol Dosepak to be followed by meloxicam. He will start utilizing his on her fracture brace is once again.

## 2014-12-11 NOTE — Patient Instructions (Signed)

## 2015-01-25 ENCOUNTER — Other Ambulatory Visit: Payer: Self-pay | Admitting: *Deleted

## 2015-01-25 NOTE — Telephone Encounter (Signed)
Faxed refill request. Last Filled:    360 tablet 1 RF on  11/24/2014  Please advise.

## 2015-01-25 NOTE — Telephone Encounter (Signed)
He needs to call the pharmacy about this.  It was sent with a refill prev.

## 2015-01-25 NOTE — Telephone Encounter (Signed)
That was to a local pharmacy.  This is from [facility] (mail order).

## 2015-01-26 MED ORDER — TRAMADOL HCL 50 MG PO TABS: ORAL_TABLET | ORAL | Status: DC

## 2015-01-26 NOTE — Telephone Encounter (Signed)
Printed.  The other rx needs to be cancelled- if it was already picked up, then don't send this rx.  Thanks.

## 2015-01-29 ENCOUNTER — Other Ambulatory Visit: Payer: Self-pay | Admitting: *Deleted

## 2015-01-29 NOTE — Telephone Encounter (Signed)
Patient only receives #120 (30 days) because that is all that the insurance will allow, apparently unless he uses Mail Order.  Rx to mail order faxed.

## 2015-03-14 ENCOUNTER — Ambulatory Visit (INDEPENDENT_AMBULATORY_CARE_PROVIDER_SITE_OTHER): Payer: BLUE CROSS/BLUE SHIELD | Admitting: Podiatry

## 2015-03-14 ENCOUNTER — Encounter: Payer: Self-pay | Admitting: Podiatry

## 2015-03-14 VITALS — BP 133/77 | HR 54 | Resp 16

## 2015-03-14 DIAGNOSIS — M722 Plantar fascial fibromatosis: Secondary | ICD-10-CM

## 2015-03-14 NOTE — Progress Notes (Signed)
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 He states that he is tried conservative therapies at home and nothing seems to be working at this point.  Objective: Vital signs are stable he's alert and oriented 3. Pulses are strongly palpable. Pain on palpation medial calcaneal tubercles bilateral. No pain with medial and lateral compression of the calcaneus.  Assessment: Chronic plantar fasciitis bilateral.  Plan: Discussed appropriate shoe gear stretching exercises ice therapy and shoe gear modifications injected 20 mg of Kenalog to the bilateral heels today. He will continue with his new shoes and I will follow-up with him as needed.

## 2015-04-04 IMAGING — CR DG KNEE AP/LAT W/ SUNRISE*L*
3 series · 3 of 3 positions shown · non-contrast
Comparison: None.

CLINICAL DATA: Knee pain.

EXAM:
DG KNEE - 3 VIEWS

[view not recorded (1 of 3)]
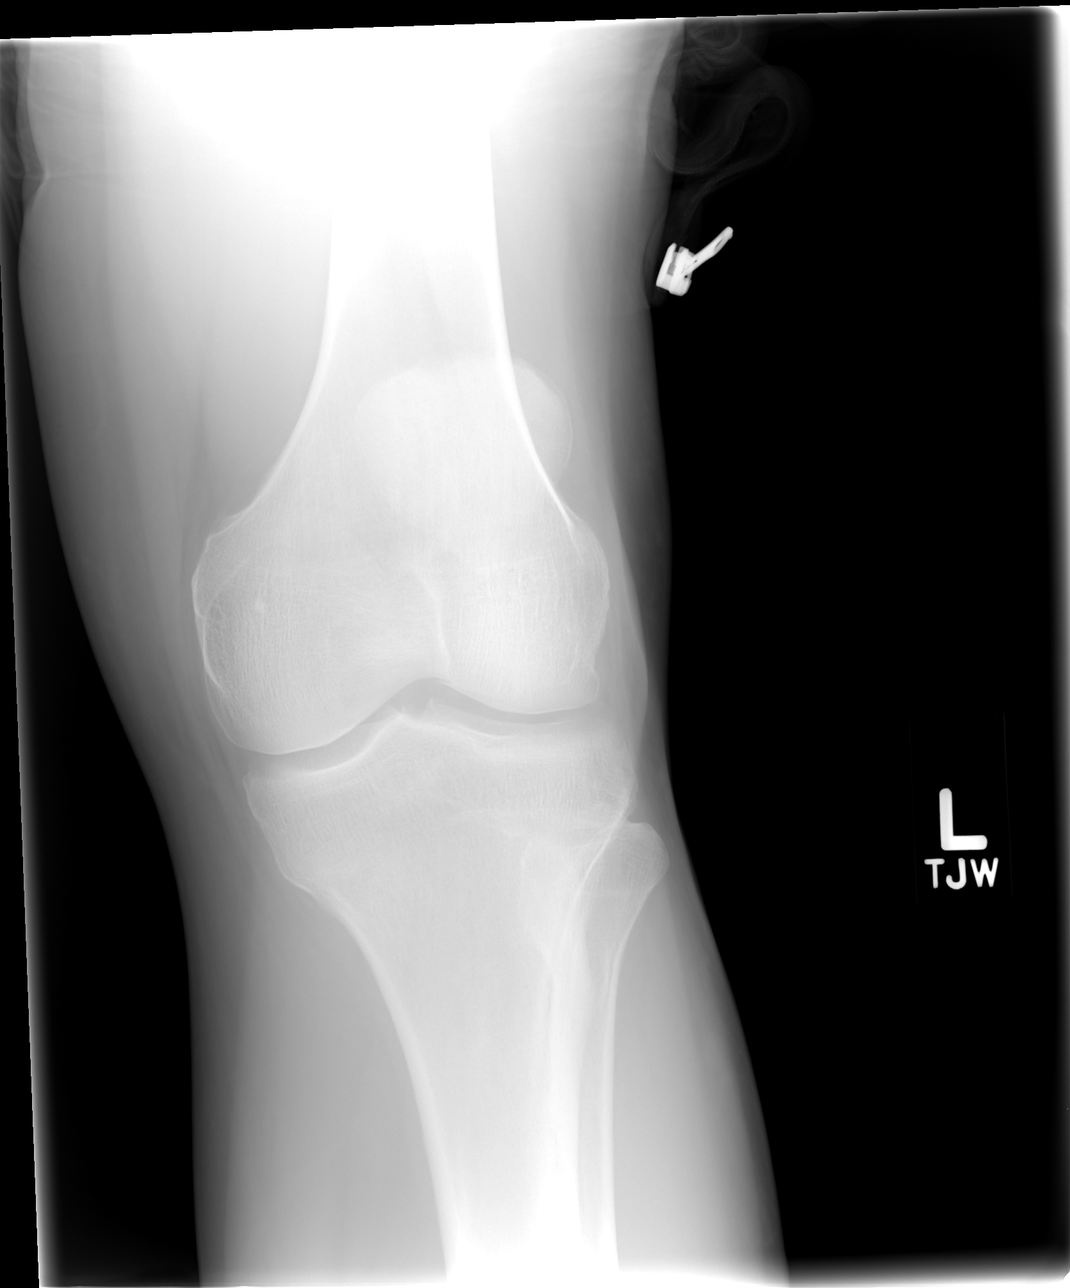

[view not recorded (2 of 3)]
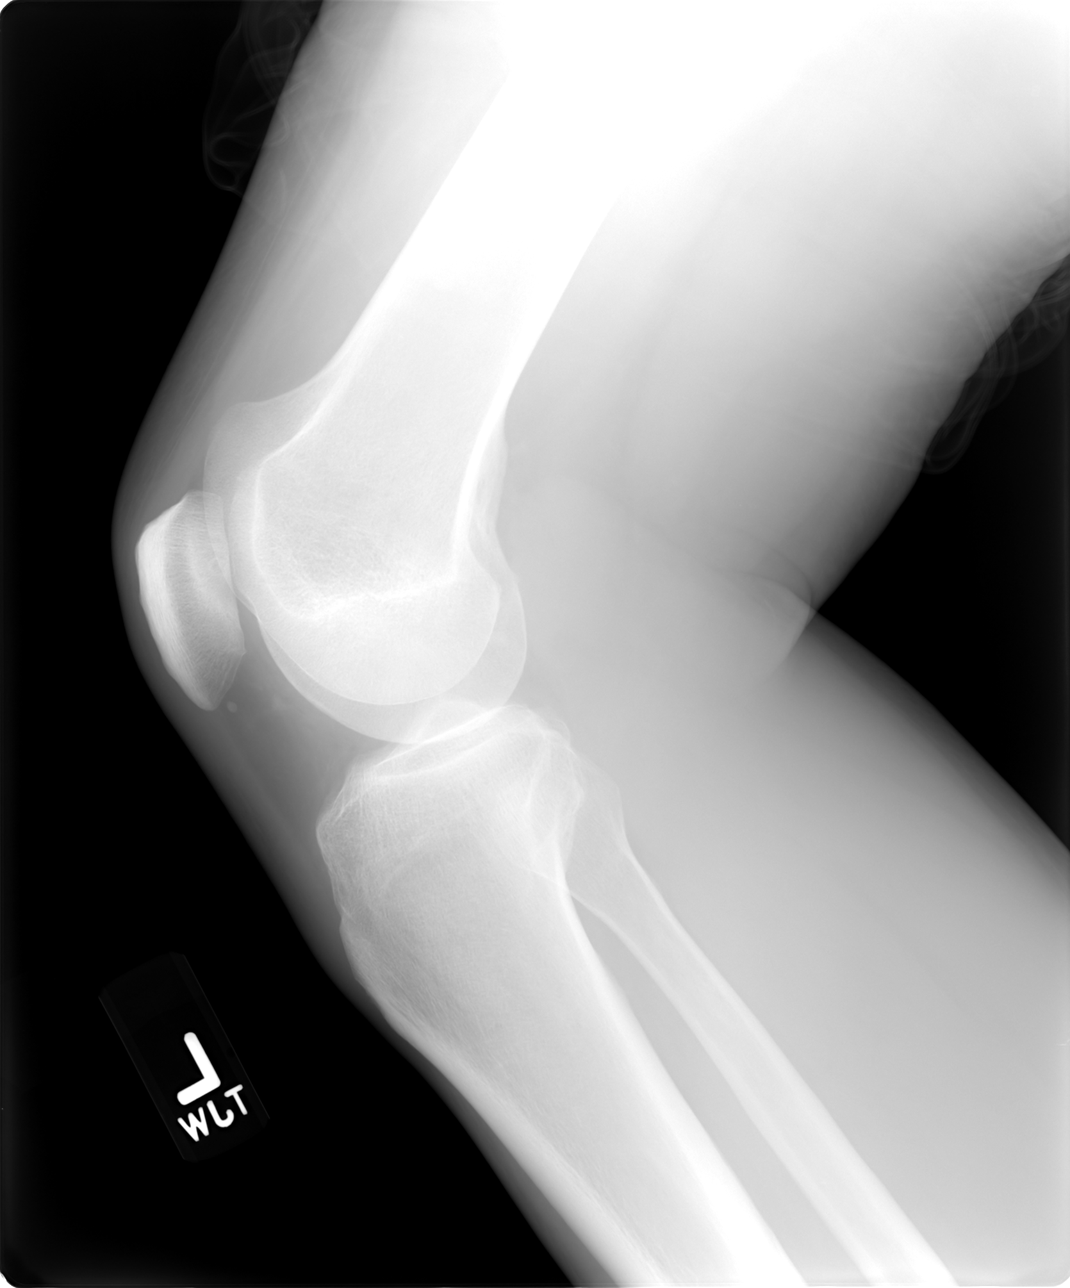

[view not recorded (3 of 3)]
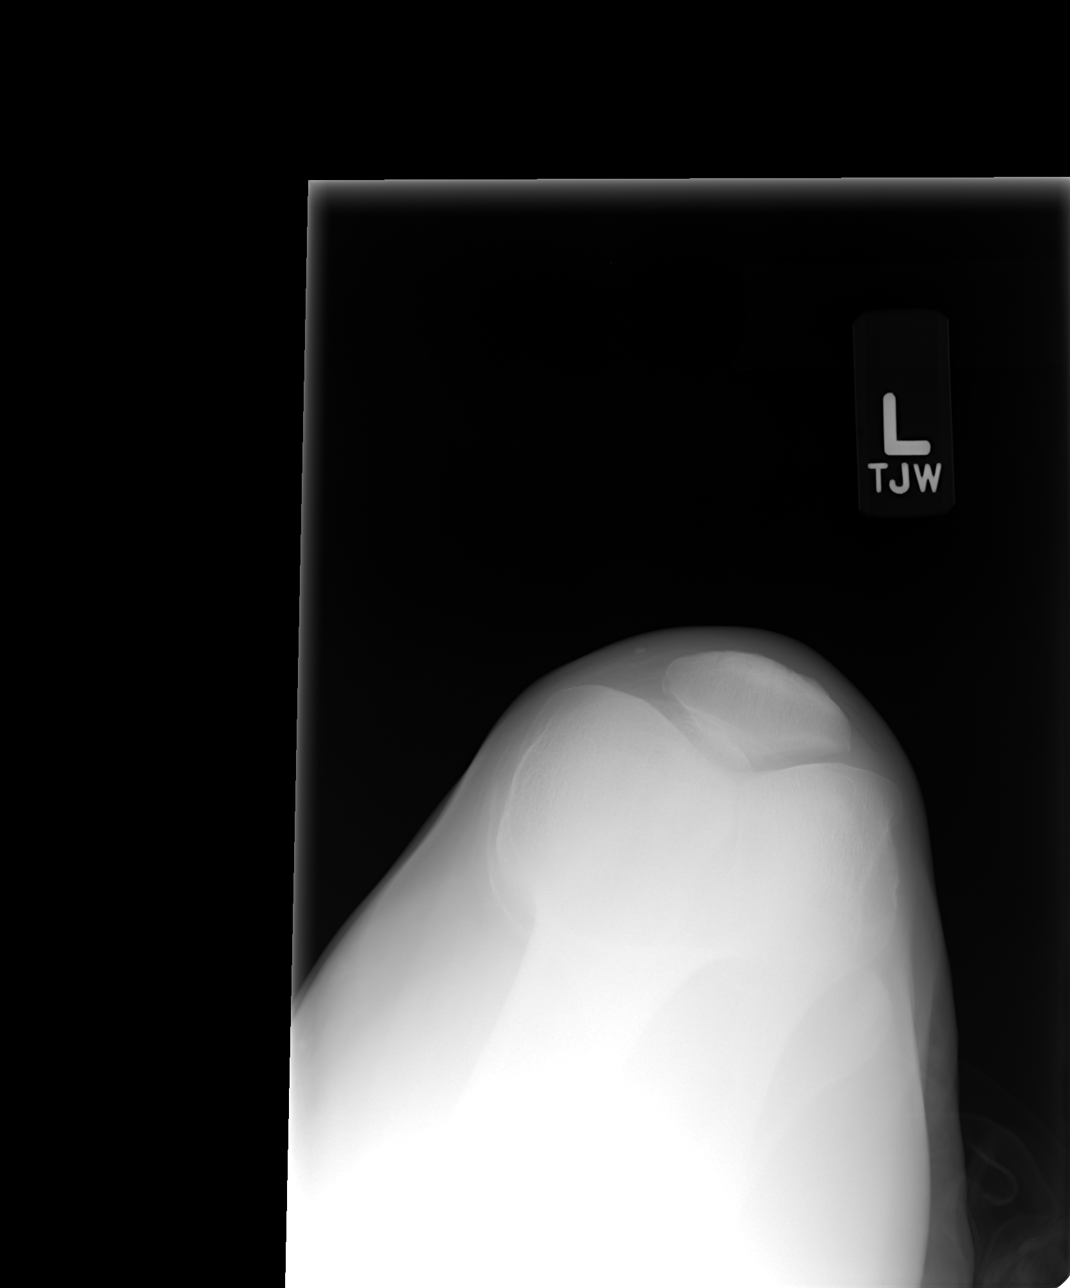

[3 of 3 positions shown; findings below may reference images not displayed]

FINDINGS: Tricompartment degenerative change noted. Tiny bony density noted
inferior an medial to the patella. Tiny loose body cannot be
excluded. No evidence of knee joint effusion. Soft tissue structures
are unremarkable. No acute bony abnormality.
IMPRESSION: Tricompartment degenerative change. Tiny bony density noted along
the inferior medial aspect of the patella, tiny loose body cannot be
excluded .

## 2015-04-04 IMAGING — CR DG KNEE AP/LAT W/ SUNRISE*R*
3 series · 3 of 3 positions shown · non-contrast
Comparison: None.

CLINICAL DATA: Knee pain.

EXAM:
DG KNEE - 3 VIEWS

[view not recorded (1 of 3)]
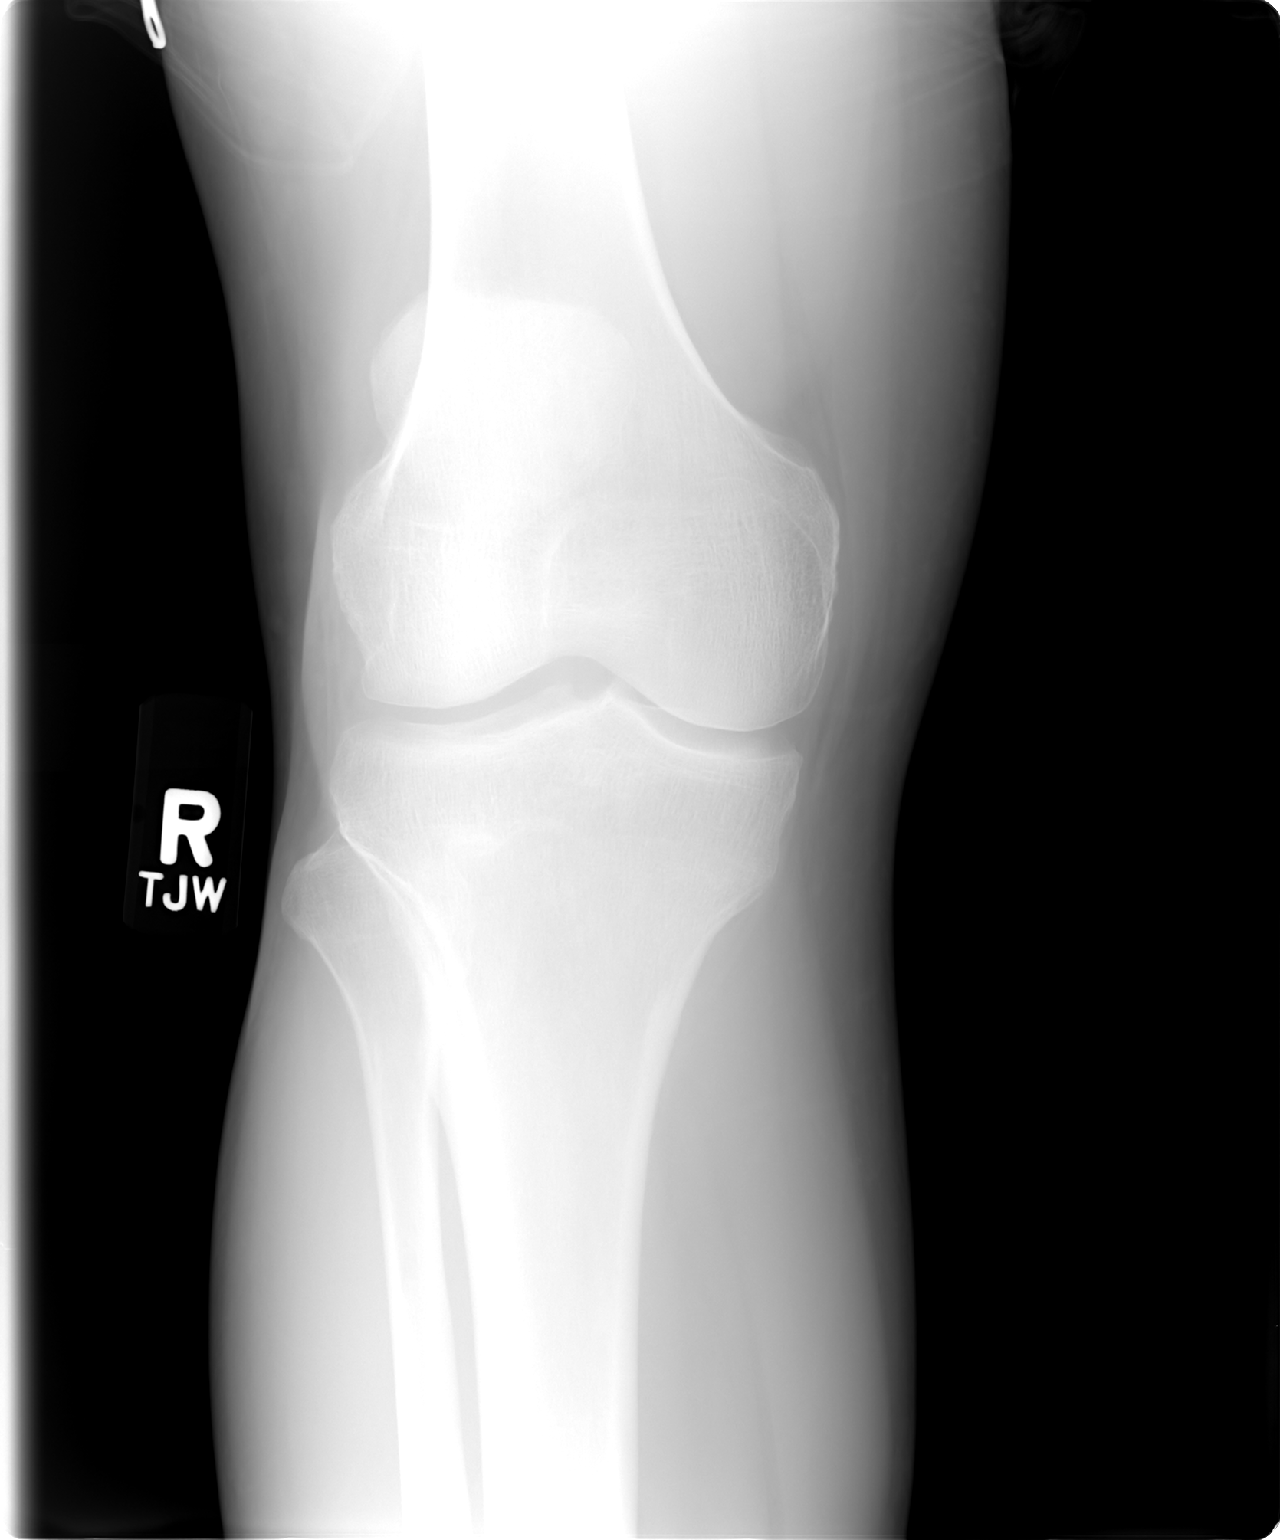

[view not recorded (2 of 3)]
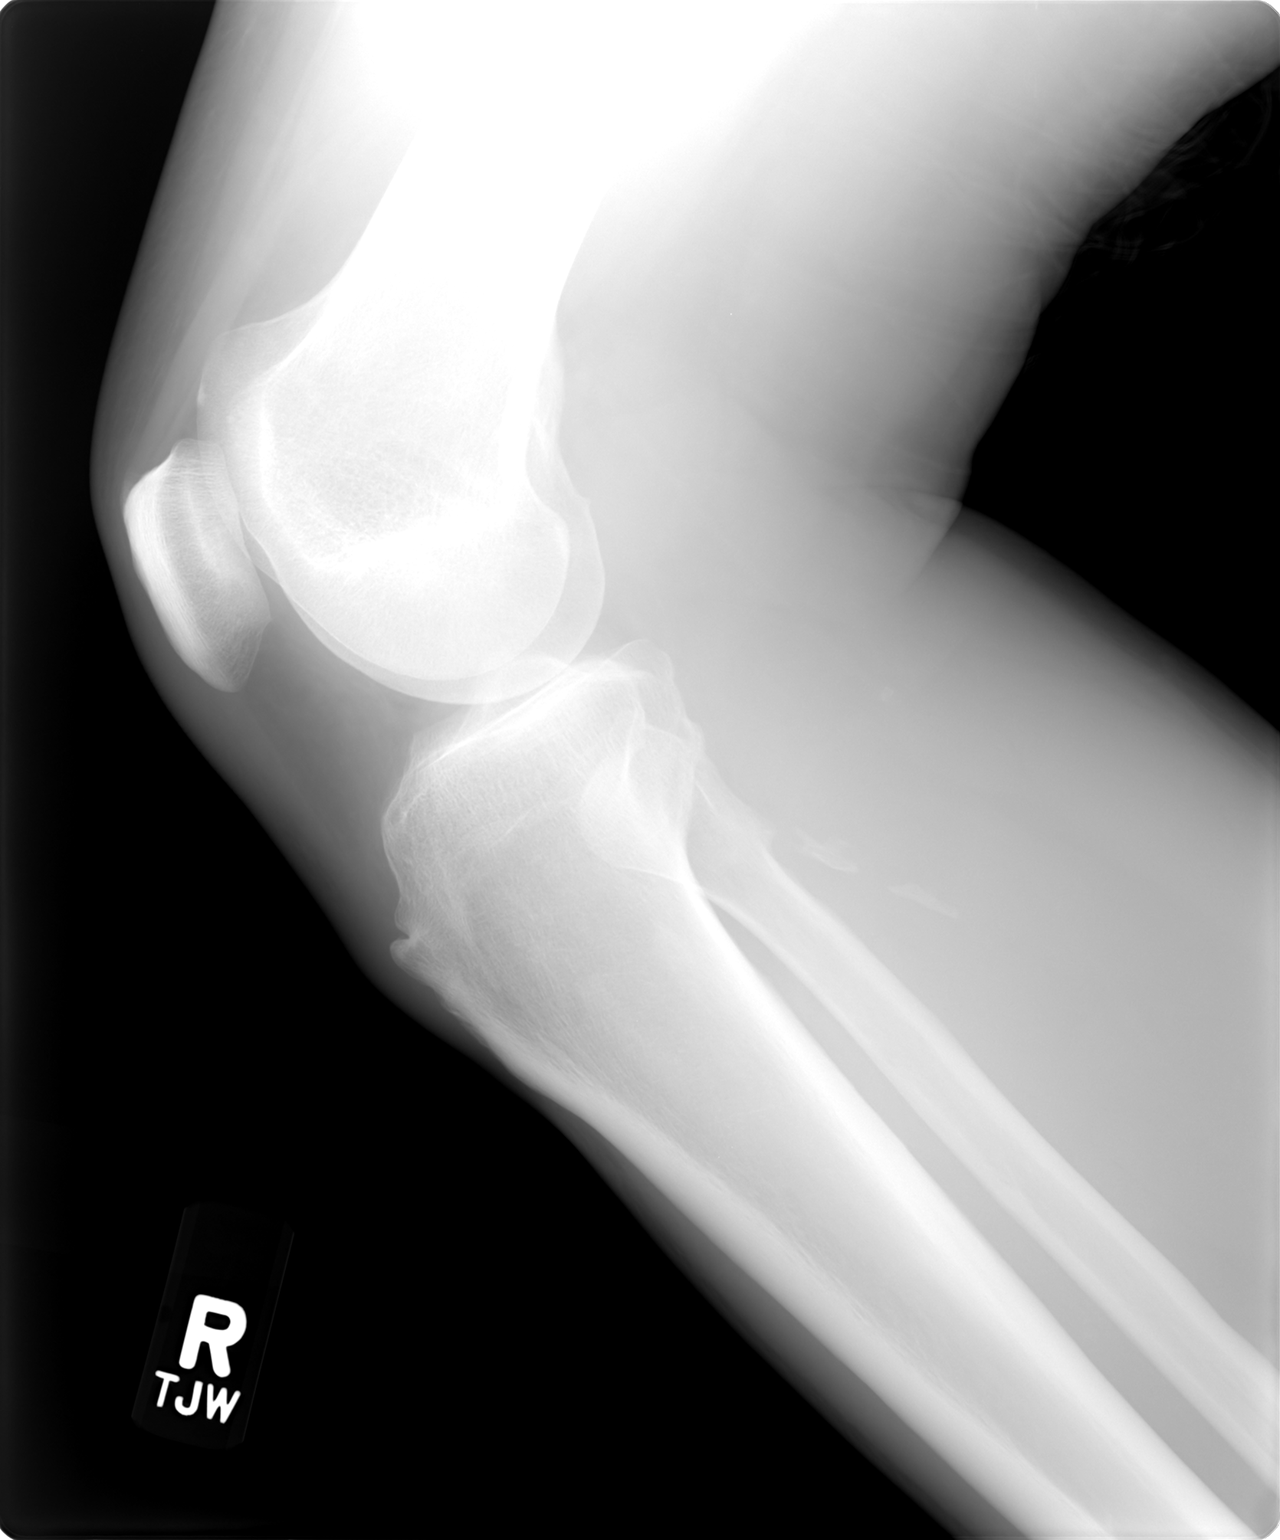

[view not recorded (3 of 3)]
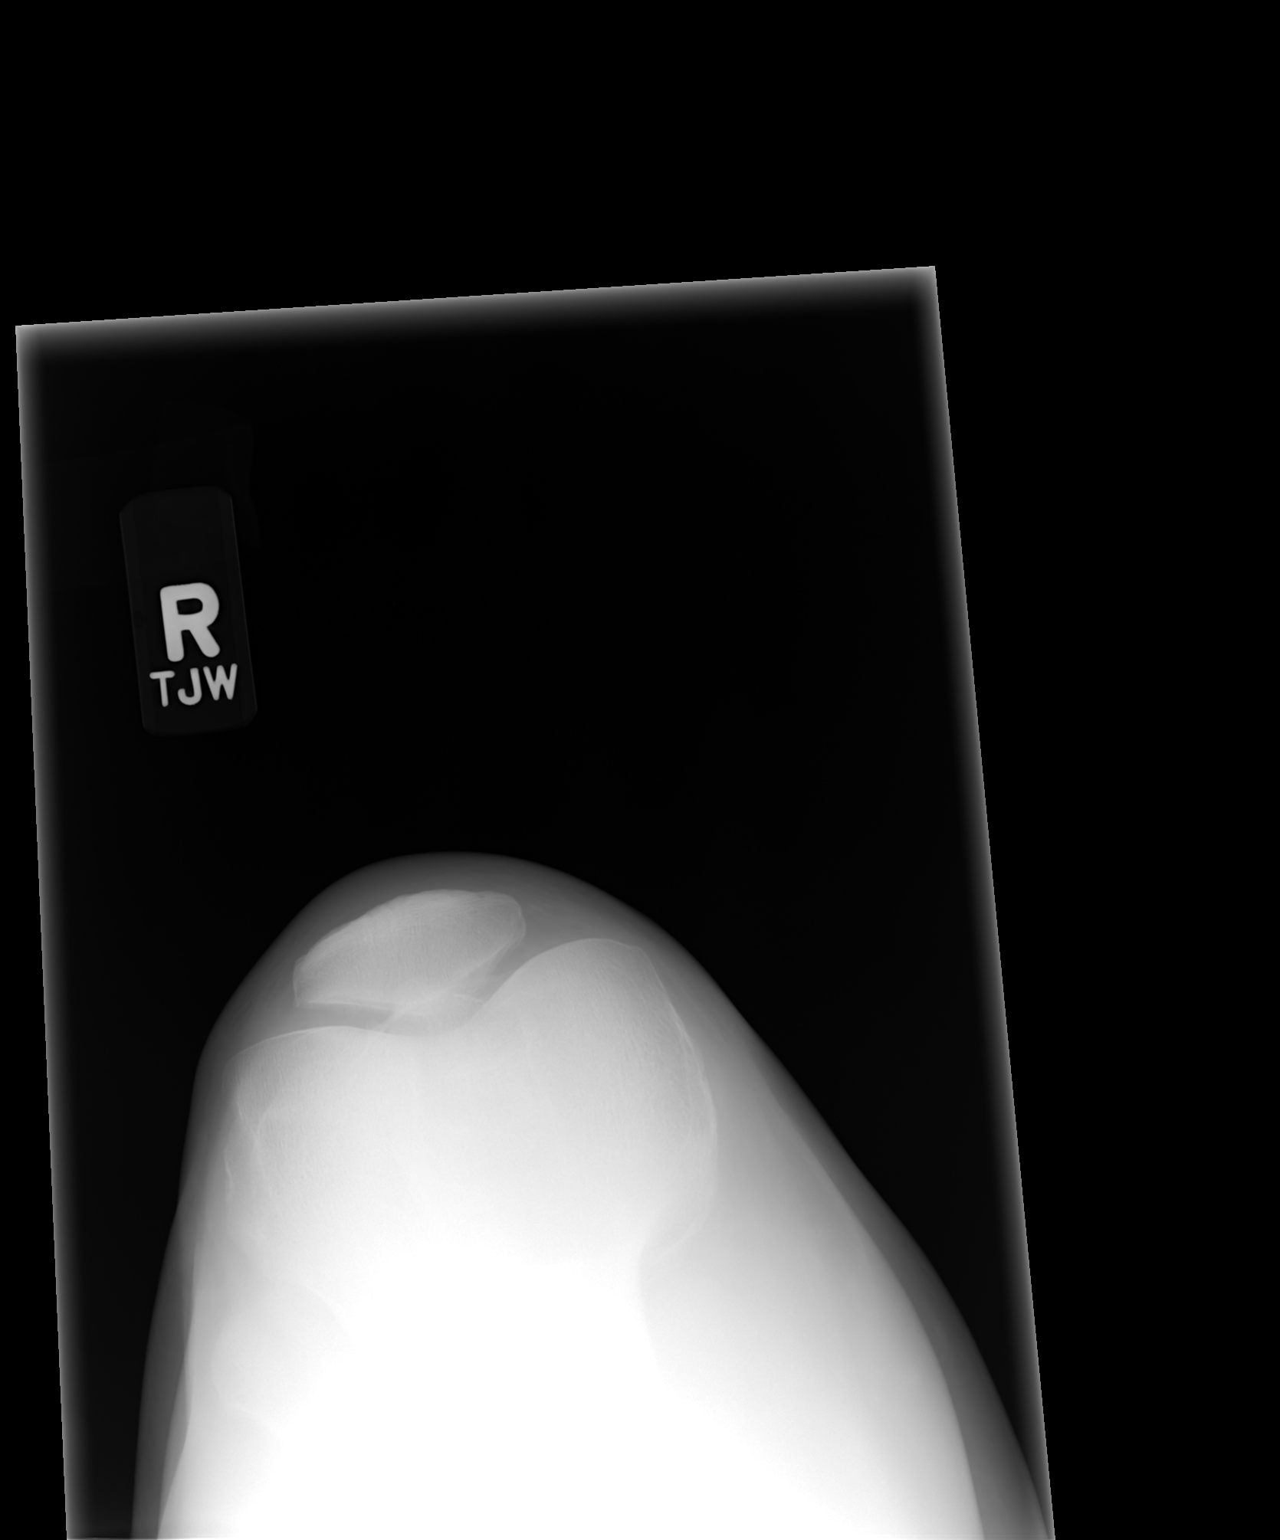

[3 of 3 positions shown; findings below may reference images not displayed]

FINDINGS: Tricompartment degenerative changes noted. Benign-appearing soft
tissue ossification noted posterior to the fibula. No acute bony or
joint abnormality.
IMPRESSION: Mild tricompartment degenerative change.

## 2015-04-30 ENCOUNTER — Ambulatory Visit (INDEPENDENT_AMBULATORY_CARE_PROVIDER_SITE_OTHER): Payer: BLUE CROSS/BLUE SHIELD | Admitting: Internal Medicine

## 2015-04-30 ENCOUNTER — Encounter: Payer: Self-pay | Admitting: Internal Medicine

## 2015-04-30 VITALS — BP 134/82 | HR 74 | Temp 98.5°F | Wt 219.0 lb

## 2015-04-30 DIAGNOSIS — R0981 Nasal congestion: Secondary | ICD-10-CM

## 2015-04-30 DIAGNOSIS — R059 Cough, unspecified: Secondary | ICD-10-CM

## 2015-04-30 DIAGNOSIS — R05 Cough: Secondary | ICD-10-CM

## 2015-04-30 MED ORDER — ALBUTEROL SULFATE HFA 108 (90 BASE) MCG/ACT IN AERS: 2.0000 | INHALATION_SPRAY | Freq: Four times a day (QID) | RESPIRATORY_TRACT | Status: DC | PRN

## 2015-04-30 NOTE — Progress Notes (Signed)
Pre visit review using our clinic review tool, if applicable. No additional management support is needed unless otherwise documented below in the visit note. 

## 2015-04-30 NOTE — Progress Notes (Signed)
HPI  Pt presents to the clinic today with c/o nasal congestion, sore throat and cough. This started 1 week ago. The cough is non productive. He is blowing clear mucous out of his nose. He denies fever, chills or body aches. He has tried Catering manager Plus and Benadryl with minimal relief. He does have a history of childhood asthma. He does not smoke. He has not had sick contacts that he is aware of.  Review of Systems      Past Medical History  Diagnosis Date  . Elevated serum creatinine     history of, likely related to muscle mass and not true renal disease  . Childhood asthma     no symptoms as adult.  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 Hyperlipidemia   . Back pain     frequent  . Knee pain     frequent    Family History  Problem Relation Age of Onset  . Diabetes Mother   . Cancer Father     prostate, dx'd in his 32's  . Prostate cancer Father   . Alcohol abuse Maternal Uncle   . Stroke Neg Hx   . Depression Neg Hx   . Drug abuse Neg Hx   . Colon cancer Neg Hx     Social History   Social History  . Marital Status: Married    Spouse Name: N/A  . Number of Children: 2  . Years of Education: N/A   Occupational History  . [employer], Museum/gallery exhibitions officer    Social History Main Topics  . Smoking status: Never Smoker   . Smokeless tobacco: Never Used  . Alcohol Use: 0.0 oz/week    0 Standard drinks or equivalent per week     Comment: rare  . Drug Use: No  . Sexual Activity: Not on file   Other Topics Concern  . Not on file   Social History Narrative   Lives with wife, has 2 daughters   From [place], in [place] since 2004   Exercise- cardio and weights.   Enjoys watching bull riding.    No Known Allergies   Constitutional: Positive headache,. Denies fatigue, fever or abrupt weight changes.  HEENT:  Positive nasal congestion, sore throat. Denies eye redness, eye pain, pressure behind the eyes, facial pain, ear  pain, ringing in the ears, wax buildup, runny nose or bloody nose. Respiratory: Positive cough. Denies difficulty breathing or shortness of breath.  Cardiovascular: Denies chest pain, chest tightness, palpitations or swelling in the hands or feet.   No other specific complaints in a complete review of systems (except as listed in HPI above).  Objective:   BP 134/82 mmHg  Pulse 74  Temp(Src) 98.5 F (36.9 C) (Oral)  Wt 219 lb (99.338 kg)  SpO2 98%  Wt Readings from Last 3 Encounters:  04/30/15 219 lb (99.338 kg)  12/05/14 216 lb 4 oz (98.09 kg)  08/28/14 219 lb 8 oz (99.565 kg)     General: Appears his stated age, well developed, well nourished in NAD. HEENT: Head: normal shape and size, no sinus tenderness noted; Eyes: sclera white, no icterus, conjunctiva pink; Ears: Tm's gray and intact, normal light reflex, + cerumen impaction noted on the right; Nose: mucosa boggy and moist, septum midline; Throat/Mouth: + PND. Teeth present, mucosa pink and moist, no exudate noted, no lesions or ulcerations noted.  Neck: No ervical lymphadenopathy.  Cardiovascular: Normal rate and rhythm. S1,S2 noted.  No murmur, rubs or gallops noted.  Pulmonary/Chest: Normal effort and diminshed vesicular breath sounds. No respiratory distress. No wheezes, rales or ronchi noted.      Assessment & Plan:   Nasal Congestion and Cough:  Take Zyrtec and Flonase OTC eRx for Albuterol inhaler Return precautions given  RTC as needed or if symptoms persist.

## 2015-04-30 NOTE — Patient Instructions (Signed)
Cough, Adult  A cough is a reflex that helps clear your throat and airways. It can help heal the body or may be a reaction to an irritated airway. A cough may only last 2 or 3 weeks (acute) or may last more than 8 weeks (chronic).  CAUSES Acute cough:  Viral or bacterial infections. Chronic cough:  Infections.  Allergies.  Asthma.  Post-nasal drip.  Smoking.  Heartburn or acid reflux.  Some medicines.  Chronic lung problems (COPD).  Cancer. SYMPTOMS   Cough.  Fever.  Chest pain.  Increased breathing rate.  High-pitched whistling sound when breathing (wheezing).  Colored mucus that you cough up (sputum). TREATMENT   A bacterial cough may be treated with antibiotic medicine.  A viral cough must run its course and will not respond to antibiotics.  Your caregiver may recommend other treatments if you have a chronic cough. HOME CARE INSTRUCTIONS   Only take over-the-counter or prescription medicines for pain, discomfort, or fever as directed by your caregiver. Use cough suppressants only as directed by your caregiver.  Use a cold steam vaporizer or humidifier in your bedroom or home to help loosen secretions.  Sleep in a semi-upright position if your cough is worse at night.  Rest as needed.  Stop smoking if you smoke. SEEK IMMEDIATE MEDICAL CARE IF:   You have pus in your sputum.  Your cough starts to worsen.  You cannot control your cough with suppressants and are losing sleep.  You begin coughing up blood.  You have difficulty breathing.  You develop pain which is getting worse or is uncontrolled with medicine.  You have a fever. MAKE SURE YOU:   Understand these instructions.  Will watch your condition.  Will get help right away if you are not doing well or get worse. Document Released: 01/31/2011 Document Revised: 10/27/2011 Document Reviewed: 01/31/2011 ExitCare Patient Information 2015 [employer]. This information is not intended  to replace advice given to you by your health care provider. Make sure you discuss any questions you have with your health care provider.  

## 2015-05-25 ENCOUNTER — Encounter: Payer: Self-pay | Admitting: Family Medicine

## 2015-05-25 ENCOUNTER — Ambulatory Visit (INDEPENDENT_AMBULATORY_CARE_PROVIDER_SITE_OTHER): Payer: BLUE CROSS/BLUE SHIELD | Admitting: Family Medicine

## 2015-05-25 VITALS — BP 128/64 | HR 62 | Temp 98.1°F | Wt 219.2 lb

## 2015-05-25 DIAGNOSIS — R05 Cough: Secondary | ICD-10-CM

## 2015-05-25 DIAGNOSIS — R059 Cough, unspecified: Secondary | ICD-10-CM

## 2015-05-25 MED ORDER — PREDNISONE 10 MG PO TABS: ORAL_TABLET | ORAL | Status: DC

## 2015-05-25 NOTE — Patient Instructions (Signed)
Take prednisone with food and then update me.  We may need to add on a different inhaler.  Take care.  Glad to see you.

## 2015-05-25 NOTE — Progress Notes (Signed)
Pre visit review using our clinic review tool, if applicable. No additional management support is needed unless otherwise documented below in the visit note.  Cough.  Seen in 04/2015 during season change.  Still with the cough, some better temporarily with inhaler.  Not as stuffy now as he was then.  Slightly HA- frontal.  Has occ sputum but not frequently.  No ST.  No fevers.  No aches, not more than typical for him.  No rash.  Some wheeze.  No ear pain.  Was out of work last night.   No heartburn.  A little post nasal gtt.   H/o noted asthma in childhood.    Meds, vitals, and allergies reviewed.   ROS: See HPI.  Otherwise, noncontributory.  GEN: nad, alert and oriented HEENT: mucous membranes moist, tm wnl OP wnl, sinuses not ttp NECK: supple w/o LA CV: rrr.  PULM: ctab, no inc wob, no wheeze ABD: soft, +bs EXT: no edema SKIN: no acute rash

## 2015-05-25 NOTE — Assessment & Plan Note (Signed)
I question allergy/RAD source with the season change and response to SABA.  Nontoxic.  Sinuses not ttp, no fevers.  Reasonable to try pred course with routine cautions and he'll update me.  We may need to add on inhaled steroid, depending on his response.   He agrees.

## 2015-07-16 ENCOUNTER — Other Ambulatory Visit: Payer: Self-pay

## 2015-07-16 MED ORDER — TRAMADOL HCL 50 MG PO TABS: ORAL_TABLET | ORAL | Status: DC

## 2015-07-16 NOTE — Telephone Encounter (Signed)
Pt request refill tramadol to [facility]; last refilled #360 x 1 on 01/26/15; last annual exam on 05/15/14; pt has acute appt for knees on 07/17/15. Please advise.

## 2015-07-16 NOTE — Telephone Encounter (Signed)
Please call in.  Thanks.   

## 2015-07-17 ENCOUNTER — Encounter: Payer: Self-pay | Admitting: Family Medicine

## 2015-07-17 ENCOUNTER — Ambulatory Visit (INDEPENDENT_AMBULATORY_CARE_PROVIDER_SITE_OTHER): Payer: BLUE CROSS/BLUE SHIELD | Admitting: Family Medicine

## 2015-07-17 VITALS — BP 128/70 | HR 71 | Temp 98.2°F | Wt 222.0 lb

## 2015-07-17 DIAGNOSIS — M25561 Pain in right knee: Secondary | ICD-10-CM

## 2015-07-17 DIAGNOSIS — M25562 Pain in left knee: Secondary | ICD-10-CM

## 2015-07-17 MED ORDER — MELOXICAM 15 MG PO TABS
7.5000 mg | ORAL_TABLET | Freq: Every day | ORAL | Status: DC
Start: 2015-07-17 — End: 2015-09-24

## 2015-07-17 NOTE — Progress Notes (Signed)
Pre visit review using our clinic review tool, if applicable. No additional management support is needed unless otherwise documented below in the visit note.   Knee pain.  He wakes up and takes 2 tramadol soon thereafter.   Takes another 2 labs later on before work.  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  800mg  ibuprofen helps some midshift.  Aleve didn't help.  He has had his knees injected prev, with limited relief, not recently.  He is trying to avoid repeat injections at this point.  He wants to stay active.    Meds, vitals, and allergies reviewed.   ROS: See HPI.  Otherwise, noncontributory.  nad Pain with walking, getting up from chair B knee ROM wnl, B patella and medial and lateral joint lines not ttp but B patellar ligament ttp

## 2015-07-17 NOTE — Patient Instructions (Signed)
Try taking 1/2 to 1 tab of meloxicam at work.  See if that helps the pain.  Take it with some food.  Continue 4 tramadol in a day.  Take care.  Glad to see you.

## 2015-07-17 NOTE — Telephone Encounter (Signed)
Rx called in as directed.   

## 2015-07-18 NOTE — Assessment & Plan Note (Signed)
Would continue 2 tramadol a day.  Likely with little benefit to inc dose of med beyond that.  Would try mobic with food prior to vs mid shift at work and see if that helps.  D/w pt.  He agrees.  He'll update me.  GI cautions given.  Should be able to tolerate.  His Cr likely underestimates his GFR given his muscle mass.

## 2015-08-16 ENCOUNTER — Ambulatory Visit (INDEPENDENT_AMBULATORY_CARE_PROVIDER_SITE_OTHER): Payer: BLUE CROSS/BLUE SHIELD | Admitting: Family Medicine

## 2015-08-16 ENCOUNTER — Encounter: Payer: Self-pay | Admitting: Family Medicine

## 2015-08-16 VITALS — BP 144/90 | HR 63 | Temp 97.8°F | Wt 227.5 lb

## 2015-08-16 DIAGNOSIS — R300 Dysuria: Secondary | ICD-10-CM

## 2015-08-16 DIAGNOSIS — M25562 Pain in left knee: Secondary | ICD-10-CM | POA: Diagnosis not present

## 2015-08-16 DIAGNOSIS — M25561 Pain in right knee: Secondary | ICD-10-CM | POA: Diagnosis not present

## 2015-08-16 LAB — POCT URINALYSIS DIPSTICK
Bilirubin, UA: NEGATIVE
Glucose, UA: NEGATIVE
Ketones, UA: NEGATIVE
Leukocytes, UA: NEGATIVE
NITRITE UA: NEGATIVE
PH UA: 6
Protein, UA: NEGATIVE
RBC UA: NEGATIVE
Spec Grav, UA: >=1.03
UROBILINOGEN UA: 4

## 2015-08-16 MED ORDER — NITROGLYCERIN 0.1 MG/HR TD PT24: MEDICATED_PATCH | TRANSDERMAL | Status: DC

## 2015-08-16 MED ORDER — SULFAMETHOXAZOLE-TRIMETHOPRIM 400-80 MG PO TABS: 1.0000 | ORAL_TABLET | Freq: Two times a day (BID) | ORAL | Status: DC

## 2015-08-16 NOTE — Assessment & Plan Note (Addendum)
Could be from true UTI vs irritation from energy drinks, d/w pt.  Continue liberal PO fluids, limit caffeine or similar.  See notes on u/a and ucx.   Hold abx for now, start if worsening sx in the meantime. He agrees.  Nontoxic.  U/a wnl, d/w pt at OV.

## 2015-08-16 NOTE — Progress Notes (Signed)
Pre visit review using our clinic review tool, if applicable. No additional management support is needed unless otherwise documented below in the visit note.  Still with B patellar ligament pain.  D/w pt about possible NTG use.  rx printed and given to patient with routine instructions.    Lower abd pain.  He was worried about a UTI.  Started having sx about 6 days ago.  He has been drinking plenty of water and cranberry juice, with some transient relief.  He had been drinking energy drinks.  Prev with some dysuria, less now.  No FCNAVD.  No hx like this prev.  No discharge, no rash.  No testicle pain, no groin pain.  Only lower abd pain.   Meds, vitals, and allergies reviewed.   ROS: See HPI.  Otherwise, noncontributory.  nad ncat Neck supple, no LA RRR  ctab abd soft, not ttp except for slightly ttp in the suprapubic area w/o rebound, normal BS Ext w/o edema No CVA pain R patellar ligament ttp.

## 2015-08-16 NOTE — Assessment & Plan Note (Signed)
Can try 1/4 NTG patch for the patellar ligament pain with routine cautions.  He can try it and if tolerated continue, with comparison to be made to the other knee.  He'll update me as needed.

## 2015-08-16 NOTE — Patient Instructions (Addendum)
Try the NTG on the right knee.  If you are lightheaded or having headaches, then take it off.  Try it on the weekend initially when you aren't working. Drink plenty of water.  We'll contact you with your lab report.  Take care.  Glad to see you.  Start the antibiotics in the meantime if you get worse.

## 2015-08-17 LAB — URINE CULTURE
COLONY COUNT: NO GROWTH
Organism ID, Bacteria: NO GROWTH

## 2015-09-20 ENCOUNTER — Other Ambulatory Visit: Payer: Self-pay | Admitting: Family Medicine

## 2015-09-20 ENCOUNTER — Other Ambulatory Visit (INDEPENDENT_AMBULATORY_CARE_PROVIDER_SITE_OTHER): Payer: BLUE CROSS/BLUE SHIELD

## 2015-09-20 DIAGNOSIS — Z125 Encounter for screening for malignant neoplasm of prostate: Secondary | ICD-10-CM

## 2015-09-20 DIAGNOSIS — Z139 Encounter for screening, unspecified: Secondary | ICD-10-CM

## 2015-09-20 DIAGNOSIS — E7800 Pure hypercholesterolemia, unspecified: Secondary | ICD-10-CM

## 2015-09-20 LAB — COMPREHENSIVE METABOLIC PANEL
ALBUMIN: 3.9 g/dL (ref 3.5–5.2)
ALK PHOS: 37 U/L — AB (ref 39–117)
ALT: 29 U/L (ref 0–53)
AST: 23 U/L (ref 0–37)
BILIRUBIN TOTAL: 0.3 mg/dL (ref 0.2–1.2)
BUN: 24 mg/dL — ABNORMAL HIGH (ref 6–23)
CALCIUM: 9.3 mg/dL (ref 8.4–10.5)
CO2: 28 meq/L (ref 19–32)
CREATININE: 1.44 mg/dL (ref 0.40–1.50)
Chloride: 105 mEq/L (ref 96–112)
GFR: 66.48 mL/min (ref >60.00)
Glucose, Bld: 97 mg/dL (ref 70–99)
Potassium: 4.4 mEq/L (ref 3.5–5.1)
Sodium: 139 mEq/L (ref 135–145)
Total Protein: 6.4 g/dL (ref 6.0–8.3)

## 2015-09-20 LAB — LIPID PANEL
CHOLESTEROL: 262 mg/dL — AB (ref 0–200)
HDL: 46.1 mg/dL (ref >39.00)
LDL Cholesterol: 190 mg/dL — ABNORMAL HIGH (ref 0–99)
NonHDL: 215.73
TRIGLYCERIDES: 131 mg/dL (ref 0.0–149.0)
Total CHOL/HDL Ratio: 6
VLDL: 26.2 mg/dL (ref 0.0–40.0)

## 2015-09-20 LAB — PSA: PSA: 0.6 ng/mL (ref 0.10–4.00)

## 2015-09-20 NOTE — Addendum Note (Signed)
Addended by: Baldomero Lamy on: 09/20/2015 03:39 PM   Modules accepted: Orders

## 2015-09-23 LAB — NICOTINE/COTININE METABOLITES
Cotinine: NOT DETECTED ng/mL
Nicotine: NOT DETECTED ng/mL

## 2015-09-24 ENCOUNTER — Ambulatory Visit (INDEPENDENT_AMBULATORY_CARE_PROVIDER_SITE_OTHER): Payer: BLUE CROSS/BLUE SHIELD | Admitting: Family Medicine

## 2015-09-24 ENCOUNTER — Encounter: Payer: Self-pay | Admitting: Family Medicine

## 2015-09-24 VITALS — BP 120/80 | HR 54 | Temp 97.7°F | Ht 70.0 in | Wt 223.2 lb

## 2015-09-24 DIAGNOSIS — M25562 Pain in left knee: Secondary | ICD-10-CM

## 2015-09-24 DIAGNOSIS — Z1211 Encounter for screening for malignant neoplasm of colon: Secondary | ICD-10-CM

## 2015-09-24 DIAGNOSIS — Z119 Encounter for screening for infectious and parasitic diseases, unspecified: Secondary | ICD-10-CM

## 2015-09-24 DIAGNOSIS — Z23 Encounter for immunization: Secondary | ICD-10-CM | POA: Diagnosis not present

## 2015-09-24 DIAGNOSIS — M25561 Pain in right knee: Secondary | ICD-10-CM

## 2015-09-24 DIAGNOSIS — Z0000 Encounter for general adult medical examination without abnormal findings: Secondary | ICD-10-CM | POA: Diagnosis not present

## 2015-09-24 DIAGNOSIS — E7800 Pure hypercholesterolemia, unspecified: Secondary | ICD-10-CM

## 2015-09-24 MED ORDER — NITROGLYCERIN 0.1 MG/HR TD PT24: MEDICATED_PATCH | TRANSDERMAL | Status: DC

## 2015-09-24 NOTE — Patient Instructions (Addendum)
Go to the lab on the way out.  We'll contact you with your lab report (stool cards). Try the knee patches (1/4 patch) and update me.  Take care.  Glad to see you.

## 2015-09-24 NOTE — Progress Notes (Signed)
Pre visit review using our clinic review tool, if applicable. No additional management support is needed unless otherwise documented below in the visit note.  CPE- See plan.  Routine anticipatory guidance given to patient.  See health maintenance. Tetanus 2009 Flu today, 2017 Shingles and PNA not due.  D/w patient Randy King:YNWGNFA for colon cancer screening, including IFOB vs. colonoscopy.  Risks and benefits of both were discussed and patient voiced understanding.  Pt elects [id number].   PSA wnl.  D/w pt.  FH noted.  Wife designated if patient were incapacitated.   Diet and exercise d/w pt.  Doing well with both.   Pt opts in for HCV and HIV screening with next set of labs.  D/w pt re: routine screening.   He has f/u with foot clinic re: plantar fasciitis.    Couldn't tolerate meloxicam.  D/w pt about NTG trial.  rx printed for patient with routine cautions.    PMH and SH reviewed  Meds, vitals, and allergies reviewed.   ROS: See HPI.  Otherwise negative.    GEN: nad, alert and oriented HEENT: mucous membranes moist NECK: supple w/o LA CV: rrr. PULM: ctab, no inc wob ABD: soft, +bs EXT: no edema SKIN: no acute rash

## 2015-09-25 ENCOUNTER — Telehealth: Payer: Self-pay | Admitting: Family Medicine

## 2015-09-25 NOTE — Assessment & Plan Note (Signed)
Framingham score 7.6% chance of MI in the next 10 years, meaning he has ~93% chance of not having a problem.   If he could get TC down 30 points with diet and exercise, then his risk would be about 6%, which would be similiar to the expected benefit from statin.   Noted that his risk score may overstate his risk of CAD, since he has no FH.   At this point, wouldn't start statin but would continue work on diet and exercise.

## 2015-09-25 NOTE — Telephone Encounter (Signed)
Left message on patient's voicemail to return call

## 2015-09-25 NOTE — Assessment & Plan Note (Signed)
He'll try NTG patch for tendonitis and see if that helps. Routine cautions given.  Continue tramadol in meantime.

## 2015-09-25 NOTE — Telephone Encounter (Signed)
Patient advised.

## 2015-09-25 NOTE — Telephone Encounter (Signed)
Pt returned your call\ Best number [phone] before 4

## 2015-09-25 NOTE — Assessment & Plan Note (Signed)
Tetanus 2009  Flu today, 2017  Shingles and PNA not due.  D/w patient [id number] for colon cancer screening, including IFOB vs. colonoscopy. Risks and benefits of both were discussed and patient voiced understanding. Pt elects [id number].  PSA wnl. D/w pt. FH noted.  Wife designated if patient were incapacitated.  Diet and exercise d/w pt. Doing well with both.  Pt opts in for HCV and HIV screening with next set of labs. D/w pt re: routine screening.  He has f/u with foot clinic re: plantar fasciitis.

## 2015-09-25 NOTE — Telephone Encounter (Signed)
Call pt.  I did some checking on his lipids.  Framingham score 7.6% chance of MI in the next 10 years, meaning he has ~93% chance of not having a problem.   If he could get TC down 30 points with diet and exercise, then his risk would be about 6%, which would be similiar to the expected benefit from statin.   Noted that his risk score may overstate his risk of CAD, since he has no FH.   At this point, wouldn't start statin but would continue work on diet and exercise.  Let me know if he has concerns.   Thanks.

## 2016-01-13 ENCOUNTER — Other Ambulatory Visit: Payer: Self-pay | Admitting: Family Medicine

## 2016-01-15 NOTE — Telephone Encounter (Signed)
Pt left v/m requesting status of tramadol requested by [facility]; pt last seen for annual exam on 09/24/15 and rx last refilled # 360 x 1 on 07/16/15. [facility]. Pt request to pick up med 01/15/16.

## 2016-01-15 NOTE — Telephone Encounter (Signed)
Electronic refill request. Last Filled:    360 tablet 1 07/16/2015  Last office visit:   09/24/15  CPE   Please advise.

## 2016-01-16 ENCOUNTER — Other Ambulatory Visit: Payer: Self-pay | Admitting: Family Medicine

## 2016-01-16 NOTE — Telephone Encounter (Signed)
Pt left v/m requesting cb about tramadol refill. 

## 2016-01-16 NOTE — Telephone Encounter (Signed)
Pt is at pharmacy and wants to know why his tramadol has not been called in yet; advised pt we got the [facility] on a Sun [facility]; we do ask for 24 -48 hour notice and pt voiced understanding. I did advise pt I would [facility] now since pt is at the pharmacy waiting. Pt voiced understanding. [facility] as instructed.

## 2016-01-16 NOTE — Telephone Encounter (Signed)
We couldn't get to this during business hours yesterday.  We need 24 hour notice.  Please call in.  Thanks.

## 2016-01-17 ENCOUNTER — Encounter: Payer: Self-pay | Admitting: Family Medicine

## 2016-01-17 ENCOUNTER — Ambulatory Visit (INDEPENDENT_AMBULATORY_CARE_PROVIDER_SITE_OTHER): Payer: BLUE CROSS/BLUE SHIELD | Admitting: Family Medicine

## 2016-01-17 VITALS — BP 126/84 | HR 66 | Temp 97.6°F | Wt 226.0 lb

## 2016-01-17 DIAGNOSIS — M545 Low back pain, unspecified: Secondary | ICD-10-CM

## 2016-01-17 NOTE — Patient Instructions (Signed)
Keep stretching your hamstrings, stay out of boots for now and use heat/tramadol.  Take care.  Glad to see you.

## 2016-01-17 NOTE — Progress Notes (Signed)
Pre visit review using our clinic review tool, if applicable. No additional management support is needed unless otherwise documented below in the visit note.  He's been rubbing his knees with B epsom salt oil and that has been helping, getting some relief with that.  He got out of cowboy boots and his hamstrings are better in the meantime.  He has tramadol to use, no ADE.  He is limiting squats.    B lower back pain.  Random onset of pain.  Usually together, but not in the midline.  Coming and going for a few months.  Last for a brief period.  Radiating pain, sharp, not dull.  Not everyday.   No FCNAVD.  No radicular leg pain.  He is already stretching his back.  He has cut back on wearing his boots and that may have contributed.   He doesn't have urinary sx unless he taking a lot of citrus intake.  D/w pt.  That seems to be the only trigger.  He doesn't have urinary sx with the problems above.    Meds, vitals, and allergies reviewed.   ROS: Per HPI unless specifically indicated in ROS section   GEN: nad, alert and oriented NECK: supple w/o LA CV: rrr.   PULM: ctab, no inc wob ABD: soft, +bs EXT: no edema Back not ttp, no CVA pain S/S grossly wnl BLE SLR neg but R hamstring tight.

## 2016-01-17 NOTE — Assessment & Plan Note (Signed)
Urinary sx appear to be diet related, with citrus, and unrelated to current back pain.  No sign of pyelo on exam.  No cva pain.  Advised to keep stretching his hamstrings, stay out of boots for now and use heat/tramadol.  Likely benign MSK source.  No imaging needed.  He agrees.  Update me as needed.

## 2016-01-21 ENCOUNTER — Encounter: Payer: Self-pay | Admitting: Podiatry

## 2016-01-21 ENCOUNTER — Ambulatory Visit (INDEPENDENT_AMBULATORY_CARE_PROVIDER_SITE_OTHER): Payer: BLUE CROSS/BLUE SHIELD | Admitting: Podiatry

## 2016-01-21 DIAGNOSIS — M722 Plantar fascial fibromatosis: Secondary | ICD-10-CM

## 2016-01-21 NOTE — Progress Notes (Signed)
He presents today with a chief complaint of a painful left heel. He states has been bothering him for the past couple of weeks after having had no trouble with it for over a year. Denies changes in his past medical history medications or allergies.  Objective: Vital signs are stable alert and oriented 3. Pulses are palpable. Neurologic sensorium is intact. He has pain on palpation medially continue tubercle of the left heel.  Assessment: Chronic intractable plantar fasciitis left heel.  Plan: Injected the left heel today with Kenalog and local anesthetic discussed appropriate shoe gear stretching her size ice therapy and shoe modifications.

## 2016-02-14 ENCOUNTER — Ambulatory Visit: Payer: BLUE CROSS/BLUE SHIELD | Admitting: Podiatry

## 2016-02-25 ENCOUNTER — Other Ambulatory Visit: Payer: Self-pay | Admitting: Family Medicine

## 2016-03-05 ENCOUNTER — Ambulatory Visit: Payer: BLUE CROSS/BLUE SHIELD | Admitting: Podiatry

## 2016-04-15 ENCOUNTER — Ambulatory Visit (INDEPENDENT_AMBULATORY_CARE_PROVIDER_SITE_OTHER): Payer: BLUE CROSS/BLUE SHIELD | Admitting: Podiatry

## 2016-04-15 ENCOUNTER — Encounter: Payer: Self-pay | Admitting: Podiatry

## 2016-04-15 DIAGNOSIS — M629 Disorder of muscle, unspecified: Secondary | ICD-10-CM | POA: Diagnosis not present

## 2016-04-15 MED ORDER — IBUPROFEN 600 MG PO TABS: 600.0000 mg | ORAL_TABLET | Freq: Three times a day (TID) | ORAL | 0 refills | Status: DC | PRN

## 2016-04-15 NOTE — Progress Notes (Signed)
Subjective: 50 year old male presents the office with concerns of left heel pain. He is been undergoing treatment for plantar fasciitis. He states he was exercising over the weekend when up on his toes and felt a pop and arch and senses had some swelling to the arch of his foot. He states the pain has subsided he does continue to have pain and swelling to the area. Denies any systemic complaints such as fevers, chills, nausea, vomiting. No acute changes since last appointment, and no other complaints at this time.   Objective: AAO x3, NAD DP/PT pulses palpable bilaterally, CRT less than 3 seconds There is edema to the plantar aspect of left heel there is tenderness on the course of the plantar fascia. There is some indistinctness within the medial band of the plantar fascia on the insertion of the calcaneus however due to the swelling difficult to evaluate. There is no erythema or increase in warmth. There is no pain with lateral compression of calcaneus or any other areas of pinpoint tenderness. No edema, erythema, increase in warmth to bilateral lower extremities.  No open lesions or pre-ulcerative lesions.  No pain with calf compression, swelling, warmth, erythema  Assessment: Partial plantar fascial tear left heel  Plan: -All treatment options discussed with the patient including all alternatives, risks, complications.  -Patient left the office today before able to obtain x-rays. -Recommended return to CAM boot. He states he has this at home. -Prescribed ibuprofen -Ice and elevation -Limit activity -Follow-up in 3 weeks or sooner if needed. -Patient encouraged to call the office with any questions, concerns, change in symptoms.   Ovid CurdMatthew Wagoner, DPM

## 2016-05-07 ENCOUNTER — Ambulatory Visit (INDEPENDENT_AMBULATORY_CARE_PROVIDER_SITE_OTHER): Payer: BLUE CROSS/BLUE SHIELD | Admitting: Podiatry

## 2016-05-07 ENCOUNTER — Encounter: Payer: Self-pay | Admitting: Podiatry

## 2016-05-07 DIAGNOSIS — M629 Disorder of muscle, unspecified: Secondary | ICD-10-CM | POA: Diagnosis not present

## 2016-05-07 MED ORDER — OXYCODONE-ACETAMINOPHEN 10-325 MG PO TABS: 1.0000 | ORAL_TABLET | Freq: Three times a day (TID) | ORAL | 0 refills | Status: DC | PRN

## 2016-05-07 NOTE — Progress Notes (Signed)
He presents today for follow-up of his plantar fascia tear left heel. He states it is doing much better but is painful while he is at work. He is unable to wear his Cam Walker at work but wears his Manufacturing systems engineerCam Walker when he arrives home.  Objective: Vital signs are stable he is alert and oriented 3. Pulses are palpable. He has pain on palpation plantar medial aspect of the left heel no plantar fascia is palpable. No ecchymosis no edema noted erythematous cellulitis drainage or odor.  Assessment: Ruptured plantar fascia medial band left foot.  Plan: Encouraged him to continue to wear his Cam Dan HumphreysWalker and also wrote him a prescription for Percocet No. 40 and I will follow-up with him in 2 months.

## 2016-06-11 ENCOUNTER — Other Ambulatory Visit: Payer: Self-pay | Admitting: Family Medicine

## 2016-06-11 NOTE — Telephone Encounter (Signed)
Received refill electronically Last refill 02/26/16 - one inhaler given Last office visit 01/17/16

## 2016-06-12 NOTE — Telephone Encounter (Signed)
Left detailed message on voicemail.  

## 2016-06-12 NOTE — Telephone Encounter (Signed)
Sent.  Thanks.  If needed frequently, ie mult times per week then needs OV to discuss use/options.  Thanks.

## 2016-06-16 ENCOUNTER — Ambulatory Visit (INDEPENDENT_AMBULATORY_CARE_PROVIDER_SITE_OTHER): Payer: BLUE CROSS/BLUE SHIELD | Admitting: Family Medicine

## 2016-06-16 ENCOUNTER — Encounter: Payer: Self-pay | Admitting: Family Medicine

## 2016-06-16 VITALS — BP 150/94 | HR 58 | Temp 97.7°F | Wt 225.2 lb

## 2016-06-16 DIAGNOSIS — M545 Low back pain: Secondary | ICD-10-CM | POA: Diagnosis not present

## 2016-06-16 DIAGNOSIS — J45909 Unspecified asthma, uncomplicated: Secondary | ICD-10-CM

## 2016-06-16 DIAGNOSIS — G8929 Other chronic pain: Secondary | ICD-10-CM | POA: Diagnosis not present

## 2016-06-16 DIAGNOSIS — Z23 Encounter for immunization: Secondary | ICD-10-CM

## 2016-06-16 DIAGNOSIS — M722 Plantar fascial fibromatosis: Secondary | ICD-10-CM | POA: Diagnosis not present

## 2016-06-16 MED ORDER — FLUTICASONE-SALMETEROL 250-50 MCG/DOSE IN AEPB: 1.0000 | INHALATION_SPRAY | Freq: Two times a day (BID) | RESPIRATORY_TRACT | 3 refills | Status: DC

## 2016-06-16 MED ORDER — TRAMADOL HCL 50 MG PO TABS: ORAL_TABLET | ORAL | 1 refills | Status: DC

## 2016-06-16 NOTE — Progress Notes (Signed)
Recently with pseudophed use.  D/w pt.  Mild elevation in BP likely related.    He has f/u with podiatry clinic re: foot pain.  Some better in the meantime.   Asthma.  Needed SABA more often recently.  No triggers other than weather changes.  No smoking.  No sig smoke exposures.  Wheeze > cough.  No sputum.  No fevers, no chills. SABA helps.  In a week, has used SABA about 3-4 times.  The month before he was needing it up to twice a day.  Flu shot today.    Back pain.  Still with lower back pain.  No new sx.  No leg weakness, no B/B sx.  D/w pt about dosing tramadol.  On bad days needs 6 pills (100mg  tid prn).  No ADE on med. Out of work last night.    Meds, vitals, and allergies reviewed.   ROS: Per HPI unless specifically indicated in ROS section   GEN: nad, alert and oriented HEENT: mucous membranes moist NECK: supple w/o LA CV: rrr. PULM: ctab, no inc wob ABD: soft, +bs EXT: no edema SKIN: no acute rash Back not ttp in midline.

## 2016-06-16 NOTE — Progress Notes (Signed)
Pre visit review using our clinic review tool, if applicable. No additional management support is needed unless otherwise documented below in the visit note. 

## 2016-06-16 NOTE — Patient Instructions (Signed)
Limit tramadol as much as possible.  Max 6 pills per day.  Keep the appointment with the foot clinic.  Start the advair if you need albuterol more then 2-3 times per week.  Take care.  Glad to see you.

## 2016-06-17 DIAGNOSIS — J45909 Unspecified asthma, uncomplicated: Secondary | ICD-10-CM | POA: Insufficient documentation

## 2016-06-17 DIAGNOSIS — J4541 Moderate persistent asthma with (acute) exacerbation: Secondary | ICD-10-CM | POA: Insufficient documentation

## 2016-06-17 NOTE — Assessment & Plan Note (Signed)
History of significant symptoms in childhood. Less symptoms as an adult. He is improved now compared to where he was a month ago. He is using albuterol occasionally now. If need more frequently than I want him to start Advair. He has been on Advair in the past. He understands routine cautions, rinsing after use. Update me as needed. >25 minutes spent in face to face time with patient, >50% spent in counselling or coordination of care.

## 2016-06-17 NOTE — Assessment & Plan Note (Signed)
Per podiatry.  App input.

## 2016-06-17 NOTE — Assessment & Plan Note (Signed)
Okay to take up to 6 tramadol per day. He can take 2 tablets up to 3 times a day. Routine cautions given. Continue stretching. If not improved, tolerable, then we need to refer patient to orthopedics. He agrees.

## 2016-06-18 ENCOUNTER — Ambulatory Visit: Payer: BLUE CROSS/BLUE SHIELD | Admitting: Podiatry

## 2016-06-20 ENCOUNTER — Other Ambulatory Visit: Payer: Self-pay | Admitting: *Deleted

## 2016-06-20 NOTE — Telephone Encounter (Signed)
Electronic refill request. Last Filled:     18 Inhaler 1 06/12/2016  This refill went to local pharmacy.  This is mail order request.  Please advise.

## 2016-06-22 MED ORDER — ALBUTEROL SULFATE HFA 108 (90 BASE) MCG/ACT IN AERS
INHALATION_SPRAY | RESPIRATORY_TRACT | 3 refills | Status: DC
Start: 2016-06-22 — End: 2017-07-03

## 2016-06-22 NOTE — Telephone Encounter (Signed)
Sent. Thanks.   

## 2016-08-20 ENCOUNTER — Encounter: Payer: Self-pay | Admitting: Family Medicine

## 2016-08-20 ENCOUNTER — Ambulatory Visit (INDEPENDENT_AMBULATORY_CARE_PROVIDER_SITE_OTHER): Payer: BLUE CROSS/BLUE SHIELD | Admitting: Family Medicine

## 2016-08-20 DIAGNOSIS — J069 Acute upper respiratory infection, unspecified: Secondary | ICD-10-CM | POA: Diagnosis not present

## 2016-08-20 NOTE — Progress Notes (Signed)
Pre visit review using our clinic review tool, if applicable. No additional management support is needed unless otherwise documented below in the visit note. 

## 2016-08-20 NOTE — Progress Notes (Signed)
duration of symptoms: sx started about 5-6 days ago.   He felt poorly enough not to work out, which is atypical for him.  He is better in the meantime.   Rhinorrhea: yes Congestion: yes ear pain: no sore throat: improved now.   Cough: some cough, better now.  Myalgias: at baseline, not different.   No fevers.   His daughter was recently married.   Was out of work Sunday night into Monday AM, back to work in the meantime.    He had a cold sore on the L upper lip.    He hasn't needed albuterol more often, so he hasn't filled advair yet.  D/w pt.  He only needed it recently when he got the illness above.    Per HPI unless specifically indicated in ROS section   Meds, vitals, and allergies reviewed.   GEN: nad, alert and oriented HEENT: mucous membranes moist, TM w/o erythema, nasal epithelium injected, OP with minimal cobblestoning, He had a cold sore on the L upper lip, looks to be resolving.  NECK: supple w/o LA CV: rrr. PULM: ctab, no inc wob ABD: soft, +bs

## 2016-08-20 NOTE — Patient Instructions (Signed)
Update me as needed.  Take care.  Glad to see you.   

## 2016-08-20 NOTE — Assessment & Plan Note (Signed)
Nontoxic, improved in the meantime.  Not given for work.  Should fully resolve.  Update me as needed.  He agrees.

## 2016-11-03 ENCOUNTER — Encounter: Payer: Self-pay | Admitting: Podiatry

## 2016-11-03 ENCOUNTER — Ambulatory Visit (INDEPENDENT_AMBULATORY_CARE_PROVIDER_SITE_OTHER): Payer: BLUE CROSS/BLUE SHIELD | Admitting: Podiatry

## 2016-11-03 DIAGNOSIS — M722 Plantar fascial fibromatosis: Secondary | ICD-10-CM

## 2016-11-03 MED ORDER — DICLOFENAC SODIUM 75 MG PO TBEC: 75.0000 mg | DELAYED_RELEASE_TABLET | Freq: Two times a day (BID) | ORAL | 0 refills | Status: DC

## 2016-11-03 NOTE — Progress Notes (Signed)
He presents today with recurrence of plantar fasciitis of the left foot and I think my boots started to aggravate her right foot as well.  Objective: Vital signs are stable alert and oriented 3 has pain on palpation medial calcaneal tubercles bilateral. Pulses are palpable. Neurologic sensory is intact degenerative flexor intact muscle strength is normal bilateral. No open lesions or wounds are noted.  Assessment: Plantar fasciitis bilateral left greater than right.  Plan: We injected the bilateral heels then saw local anesthetic follow-up with him in 1 month necessary. Also prescribed diclofenac to be taken twice daily.

## 2016-12-03 ENCOUNTER — Ambulatory Visit: Payer: BLUE CROSS/BLUE SHIELD | Admitting: Podiatry

## 2017-01-04 ENCOUNTER — Other Ambulatory Visit: Payer: Self-pay | Admitting: Family Medicine

## 2017-01-05 NOTE — Telephone Encounter (Signed)
Please call in.  Thanks.   

## 2017-01-05 NOTE — Telephone Encounter (Signed)
Received refill electronically Last refill 06/16/16 #540/1 Last office visit 08/20/16

## 2017-01-06 NOTE — Telephone Encounter (Signed)
Rx called to pharmacy as instructed. 

## 2017-02-20 ENCOUNTER — Encounter: Payer: Self-pay | Admitting: Family Medicine

## 2017-02-20 ENCOUNTER — Ambulatory Visit (INDEPENDENT_AMBULATORY_CARE_PROVIDER_SITE_OTHER): Payer: BLUE CROSS/BLUE SHIELD | Admitting: Family Medicine

## 2017-02-20 DIAGNOSIS — J45909 Unspecified asthma, uncomplicated: Secondary | ICD-10-CM

## 2017-02-20 MED ORDER — FLUTICASONE-SALMETEROL 250-50 MCG/DOSE IN AEPB: 1.0000 | INHALATION_SPRAY | Freq: Two times a day (BID) | RESPIRATORY_TRACT | 12 refills | Status: DC

## 2017-02-20 NOTE — Patient Instructions (Signed)
Use the advair disk twice a day and rinse after use.  Keep using the albuterol puffer if needed, but you should need it less.   If not better, then let me know.  Keep using advair though the summer.  Take care.  Glad to see you.

## 2017-02-20 NOTE — Progress Notes (Signed)
Asthma.  He has needed SABA more often, more than BID.  Was out of work recently from SOB.  More heat and humidity recently, that likely made his sx worse.  He has h/o worse asthma in the summer in childhood.  He hasn't needed advair at baseline until the weather change and is out/off of advair.  More wheeze, cough.  No sputum.  No fevers.  SABA helps temporarily but needed BID recently.    He can wake up with regurgitation if he doesn't prop up to sleep, d/w pt about propping up the head of his bed.    Meds, vitals, and allergies reviewed.   ROS: Per HPI unless specifically indicated in ROS section   GEN: nad, alert and oriented HEENT: mucous membranes moist NECK: supple w/o LA CV: rrr.  no murmur PULM: ctab, no inc wob, no wheeze (but early in the day and in air conditioning) ABD: soft, +bs EXT: no edema

## 2017-02-22 NOTE — Assessment & Plan Note (Signed)
Had not been on Advair recently. Restart Advair. Routine cautions given. Rinse after use. He agrees. Use albuterol as needed but hopefully he will need this less from this point on board. He may be able to taper off Advair later in the fall. He will likely need to restart this next summer. Update me as needed. Nontoxic. Okay for outpatient follow-up.

## 2017-02-23 ENCOUNTER — Ambulatory Visit: Payer: BLUE CROSS/BLUE SHIELD | Admitting: Family Medicine

## 2017-04-20 ENCOUNTER — Other Ambulatory Visit: Payer: Self-pay | Admitting: Family Medicine

## 2017-04-21 NOTE — Telephone Encounter (Signed)
Last office visit 02/20/2017.  Last refilled 01/05/2017 for #540 with 1 refill.  Ok to refill?

## 2017-04-22 NOTE — Telephone Encounter (Signed)
Thanks

## 2017-04-22 NOTE — Telephone Encounter (Signed)
Pt called to ck on refill. He is requesting a call to discuss.

## 2017-04-22 NOTE — Telephone Encounter (Signed)
Spoke to patient and was advised that he has not gotten a refill in a while.  [facility] and was advised that when they took the refill in May they recorded no refills. Tobi Bastosnna stated that she will fix this and will show that a refill should have been recorded. Tobi Bastosnna stated that she will get the refill ready for patient. Patient notified by telephone.

## 2017-04-22 NOTE — Telephone Encounter (Signed)
This is too early.  He had a six-month supply (3 months with one refill) sent in back in 12/2016.  Refill not due until 06/2017.

## 2017-06-05 ENCOUNTER — Encounter: Payer: Self-pay | Admitting: Family Medicine

## 2017-06-05 ENCOUNTER — Ambulatory Visit (INDEPENDENT_AMBULATORY_CARE_PROVIDER_SITE_OTHER): Payer: BLUE CROSS/BLUE SHIELD | Admitting: Family Medicine

## 2017-06-05 VITALS — BP 126/84 | HR 74 | Temp 98.4°F | Ht 70.0 in | Wt 219.4 lb

## 2017-06-05 DIAGNOSIS — B349 Viral infection, unspecified: Secondary | ICD-10-CM

## 2017-06-05 DIAGNOSIS — Z23 Encounter for immunization: Secondary | ICD-10-CM

## 2017-06-05 DIAGNOSIS — Z125 Encounter for screening for malignant neoplasm of prostate: Secondary | ICD-10-CM | POA: Diagnosis not present

## 2017-06-05 DIAGNOSIS — E785 Hyperlipidemia, unspecified: Secondary | ICD-10-CM | POA: Diagnosis not present

## 2017-06-05 MED ORDER — TRAMADOL HCL 50 MG PO TABS
ORAL_TABLET | ORAL | 1 refills | Status: DC
Start: 2017-06-05 — End: 2017-12-24

## 2017-06-05 NOTE — Progress Notes (Signed)
He had some aches, in his back.  Then felt weak in general, but not focally.  Had a cold sore, minimal cough.  No fevers.  He feels better in the meantime.  He never felt severely ill, but wasn't at baseline.  He is back to baseline now.  No injury to cause the aches.  No rash, no vomiting.  No cough now.  When he didn't feel well, he stayed in the house and that was atypical.  He used SABA a few times while he was sick and that helped.  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  He missed some days recently, 14th and 17th.    Still taking 6 tramadol per day, at baseline for joint aches at baseline.    Meds, vitals, and allergies reviewed.   ROS: Per HPI unless specifically indicated in ROS section   nad ncat TM wnl Nasal and OP exam wnl except for cold sore on lower lip, L side, healing.  Neck supple, no LA rrr ctab Abd soft, normal BS Ext w/o edema.

## 2017-06-05 NOTE — Patient Instructions (Signed)
Schedule a physical with labs ahead of time when convenient and possible.  Take care.  Glad to see you.  Update me as needed.

## 2017-06-07 DIAGNOSIS — B349 Viral infection, unspecified: Secondary | ICD-10-CM | POA: Insufficient documentation

## 2017-06-07 NOTE — Assessment & Plan Note (Signed)
Likely nonspecific nonfluent benign viral illness that was causing fatigue and aches and cough. Recent cold sore eruption. All appear to be resolving now. Okay to go back to work. Routine cautions given. He agrees. Work note done.   Discussed with patient about getting routine labs done for a physical. Flu shot done today. Prescription given to patient for tramadol which he uses for joint aches at baseline w/o ADE and with some relief.

## 2017-07-03 ENCOUNTER — Encounter: Payer: Self-pay | Admitting: Family Medicine

## 2017-07-03 ENCOUNTER — Ambulatory Visit (INDEPENDENT_AMBULATORY_CARE_PROVIDER_SITE_OTHER): Payer: BLUE CROSS/BLUE SHIELD | Admitting: Family Medicine

## 2017-07-03 VITALS — BP 114/74 | HR 70 | Temp 98.4°F | Wt 213.5 lb

## 2017-07-03 DIAGNOSIS — J069 Acute upper respiratory infection, unspecified: Secondary | ICD-10-CM | POA: Diagnosis not present

## 2017-07-03 MED ORDER — FLUTICASONE-SALMETEROL 250-50 MCG/DOSE IN AEPB: 1.0000 | INHALATION_SPRAY | Freq: Two times a day (BID) | RESPIRATORY_TRACT | Status: DC

## 2017-07-03 MED ORDER — ALBUTEROL SULFATE HFA 108 (90 BASE) MCG/ACT IN AERS: INHALATION_SPRAY | RESPIRATORY_TRACT | 3 refills | Status: DC

## 2017-07-03 NOTE — Patient Instructions (Signed)
If you still need to clear some sputum, then okay to take mucinex with plenty of fluid.  Continue advair twice a day- rinse after use.  Stop when better.  Continue as needed albuterol for now.  You should gradually get better.  Out of work for now.  Rest and fluids.  Update me as needed.

## 2017-07-03 NOTE — Progress Notes (Signed)
duration of symptoms: about 1 week ago.   Rhinorrhea: yes congestion:yes ear pain: mild HA: yes sore throat: yes- initial sx.  Cough: yes Myalgias: yes Fever: none known other concerns: inc SABA use this past week.  Sx gradually worse at the week went on.  He is getting a little better now.  No ST, no HA.  Still stuffy.  Some remaining wheeze.   Using SABA helps with wheeze.  His boss told him to take tomorrow off.   He missed 11/12 and 11/14.   He started back on advair in the meantime.  With relief.    Per HPI unless specifically indicated in ROS section   Meds, vitals, and allergies reviewed.   GEN: nad, alert and oriented HEENT: mucous membranes moist, TM w/o erythema, nasal epithelium injected, OP with cobblestoning, sinuses not ttp x4 NECK: supple w/o LA CV: rrr. PULM: mild B exp wheeze but o/w ctab, no inc wob, occ cough noted.  EXT: no edema

## 2017-07-05 NOTE — Assessment & Plan Note (Signed)
If you needing to clear some sputum, then okay to take mucinex with plenty of fluid.  Continue advair twice a day- rinse after use.  Stop when better.  Continue as needed albuterol for now.  He should gradually get better.  Out of work for now.  Rest and fluids.  Update me as needed.  Likely viral.  Nontoxic.

## 2017-10-23 ENCOUNTER — Encounter: Payer: Self-pay | Admitting: Family Medicine

## 2017-10-23 ENCOUNTER — Ambulatory Visit (INDEPENDENT_AMBULATORY_CARE_PROVIDER_SITE_OTHER): Payer: BLUE CROSS/BLUE SHIELD | Admitting: Family Medicine

## 2017-10-23 VITALS — BP 120/80 | HR 74 | Temp 98.8°F | Wt 219.2 lb

## 2017-10-23 DIAGNOSIS — R05 Cough: Secondary | ICD-10-CM

## 2017-10-23 DIAGNOSIS — J069 Acute upper respiratory infection, unspecified: Secondary | ICD-10-CM

## 2017-10-23 DIAGNOSIS — R059 Cough, unspecified: Secondary | ICD-10-CM

## 2017-10-23 LAB — POC INFLUENZA A&B (BINAX/QUICKVUE)
INFLUENZA A, POC: NEGATIVE
Influenza B, POC: NEGATIVE

## 2017-10-23 NOTE — Patient Instructions (Signed)
Flu negative.  Likely a non-flu virus.  You should gradually get better.  Keep using your inhalers.   Rest and fluid in the meantime.  Take care.  Glad to see you.

## 2017-10-23 NOTE — Progress Notes (Signed)
His asthma sx were worse in the last 2 weeks with more wheeze  Just recently had to restart albuterol.  Still on advair at baseline.   Other sx worse in the last few days:  Rhinorrhea: yes congestion:yes ear pain: some, B sore throat: yes Cough: mild since last night Myalgias: mild- could be from work vs exercise Sick contacts at work.   No fevers.  No vomiting, no diarrhea.   SABA helped with SOB.  He has been rinsing with advair use.    He had a flu shot.    Per HPI unless specifically indicated in ROS section   Meds, vitals, and allergies reviewed.   GEN: nad, alert and oriented HEENT: mucous membranes moist, TM w/o erythema, nasal epithelium injected, OP with cobblestoning NECK: supple w/o LA CV: rrr. PULM: ctab, no inc wob ABD: soft, +bs EXT: no edema

## 2017-10-25 NOTE — Assessment & Plan Note (Signed)
Flu negative.  Likely a non-flu virus.  He should gradually get better.  Keep using his inhalers as instructed.   Rest and fluid in the meantime.  Nontoxic.  Update me as needed.  Routine work note given.

## 2017-12-24 ENCOUNTER — Other Ambulatory Visit: Payer: Self-pay | Admitting: Family Medicine

## 2017-12-24 NOTE — Telephone Encounter (Signed)
Electronic refill request. Tramadol Last office visit:   10/23/17 Last Filled:     540 tablet 1 06/05/2017  Please advise.

## 2017-12-24 NOTE — Telephone Encounter (Signed)
Sent. Thanks.   

## 2017-12-29 NOTE — Telephone Encounter (Signed)
[facility] that [facility]. Please re -send

## 2017-12-30 NOTE — Telephone Encounter (Signed)
Please provide any information that would help with getting this PA approved.

## 2017-12-30 NOTE — Telephone Encounter (Signed)
[facility] notified as instructed by telephone. Corrie Dandy was able to send the new rx thru with #180 and that did not require a PA.  Detailed message left on voicemail (okay per DPR)  for patient advising him of the change in the quantity per insurance requirements.

## 2017-12-30 NOTE — Telephone Encounter (Signed)
Spoke to [place] [facility] and was advised that they did received the script, but unable to fill it because it requires a PA because of the quantity.  Corrie Dandy stated that she will fax the denial over from the insurance company so a PA can be done.Marland Kitchen

## 2017-12-30 NOTE — Telephone Encounter (Signed)
Noted.  If they can fill rx for #180 w/o PA (30d supply), then okay to change the rx to that.  Thanks.

## 2017-12-30 NOTE — Telephone Encounter (Signed)
Verify with pharmacy first.   I have in EMR that it was [facility] to [id number] - [place], [place] - [address].  Let me know if they didn't get it.   Thanks.

## 2018-01-18 ENCOUNTER — Ambulatory Visit (INDEPENDENT_AMBULATORY_CARE_PROVIDER_SITE_OTHER): Payer: BLUE CROSS/BLUE SHIELD | Admitting: Family Medicine

## 2018-01-18 ENCOUNTER — Encounter: Payer: Self-pay | Admitting: Family Medicine

## 2018-01-18 ENCOUNTER — Encounter

## 2018-01-18 DIAGNOSIS — M545 Low back pain, unspecified: Secondary | ICD-10-CM

## 2018-01-18 DIAGNOSIS — G8929 Other chronic pain: Secondary | ICD-10-CM | POA: Diagnosis not present

## 2018-01-18 MED ORDER — CYCLOBENZAPRINE HCL 10 MG PO TABS: 5.0000 mg | ORAL_TABLET | Freq: Three times a day (TID) | ORAL | 0 refills | Status: DC | PRN

## 2018-01-18 NOTE — Patient Instructions (Signed)
Use heat/ice and see which helps.   Try flexeril- sedation caution.  Muscle relaxer.   Gently stretch.  Update me as needed.  Take care.  Glad to see you.

## 2018-01-18 NOTE — Progress Notes (Signed)
Back pain.  Going on for about 2 weeks, maybe longer.  More pain than usual.  Taking tramadol at baseline.  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  These were recent changes.  No specific injury, MVA, fall, o/w.  B L spine and lower T spine paraspinal muscles.  No leg pain.  No B/B sx.  No FCNAVD.  Some pain twisting but ROM is still normal in the back.  He is still working out, benching 135, going "light".  No squats.    Out of work from 01/14/18 through 01/17/18.    Meds, vitals, and allergies reviewed.   ROS: Per HPI unless specifically indicated in ROS section   nad ncat Neck supple, no LA rrr ctab Back with normal range of motion for flexion and extension and twisting.  No midline back pain but he does have some bilateral paraspinal tenderness in the lower T-spine and in the L-spine.  No rash.  No bruising.  Distally neurovascular intact in the bilateral lower extremities.  Normal gait.

## 2018-01-19 NOTE — Assessment & Plan Note (Signed)
Likely muscular.  Discussed with patient about options.  No imaging needed.  Okay for outpatient follow-up.  Should improve. Use heat/ice and see which helps.   Try flexeril- sedation caution.  Gently stretch.  Update me as needed.  He agrees.

## 2018-03-09 ENCOUNTER — Telehealth: Payer: Self-pay | Admitting: Family Medicine

## 2018-03-09 NOTE — Telephone Encounter (Signed)
Copied from CRM [phone]#[id number]. Topic: Quick Communication - Rx Refill/Question >> Mar 09, 2018  3:01 PM Mcneil, Ja-Kwan wrote: Medication: Fluticasone-Salmeterol (ADVAIR DISKUS) 250-50 MCG/DOSE AEPB  Has the patient contacted their pharmacy? no  Preferred Pharmacy (with phone number or street name): [id number] [place] [place] - [address] [phone] (Phone) [phone] (Fax)   Agent: Please be advised that RX refills may take up to 3 business days. We ask that you follow-up with your pharmacy.

## 2018-03-10 ENCOUNTER — Ambulatory Visit (INDEPENDENT_AMBULATORY_CARE_PROVIDER_SITE_OTHER): Payer: BLUE CROSS/BLUE SHIELD | Admitting: Internal Medicine

## 2018-03-10 ENCOUNTER — Encounter: Payer: Self-pay | Admitting: Internal Medicine

## 2018-03-10 VITALS — BP 130/84 | HR 68 | Temp 98.1°F | Wt 214.0 lb

## 2018-03-10 DIAGNOSIS — J4540 Moderate persistent asthma, uncomplicated: Secondary | ICD-10-CM

## 2018-03-10 MED ORDER — FLUTICASONE-SALMETEROL 250-50 MCG/DOSE IN AEPB: 1.0000 | INHALATION_SPRAY | Freq: Two times a day (BID) | RESPIRATORY_TRACT | 2 refills | Status: DC

## 2018-03-10 NOTE — Patient Instructions (Signed)
Shortness of Breath, Adult  Shortness of breath means you have trouble breathing. Your lungs are organs for breathing.  Follow these instructions at home:  Pay attention to any changes in your symptoms. Take these actions to help with your condition:  ? Do not smoke. Smoking can cause shortness of breath. If you need help to quit smoking, ask your doctor.  ? Avoid things that can make it harder to breathe, such as:  ? Mold.  ? Dust.  ? Air pollution.  ? Chemical smells.  ? Things that can cause allergy symptoms (allergens), if you have allergies.  ? Keep your living space clean and free of mold and dust.  ? Rest as needed. Slowly return to your usual activities.  ? Take over-the-counter and prescription medicines, including oxygen and inhaled medicines, only as told by your doctor.  ? Keep all follow-up visits as told by your doctor. This is important.  Contact a doctor if:  ? Your condition does not get better as soon as expected.  ? You have a hard time doing your normal activities, even after you rest.  ? You have new symptoms.  Get help right away if:  ? You have trouble breathing when you are resting.  ? You feel light-headed or you faint.  ? You have a cough that is not helped by medicines.  ? You cough up blood.  ? You have pain with breathing.  ? You have pain in your chest, arms, shoulders, or belly (abdomen).  ? You have a fever.  ? You cannot walk up stairs.  ? You cannot exercise the way you normally do.  This information is not intended to replace advice given to you by your health care provider. Make sure you discuss any questions you have with your health care provider.  Document Released: 01/21/2008 Document Revised: 08/21/2016 Document Reviewed: 08/21/2016  Elsevier Interactive Patient Education ? 2017 Elsevier Inc.

## 2018-03-10 NOTE — Progress Notes (Signed)
Subjective:    Patient ID: Randy King, male    DOB: 1966/07/03, 52 y.o.   MRN: [id number]  HPI  Pt presents to the clinic today with c/o increase shortness of breath. He reports this started 1-2 weeks ago. He denies runny nose, nasal congestion, ear pain, sore throat, cough or chest tightness. The SOB seems worse with exertion, but he feels like this is because of the recent high temps and humidity. He reports he also ran out of his Advair about 1-2 weeks ago, not sure if that is a contributing factor. He has been using his Albuterol, but does not feel like it is as helpful as the Advair. He does not smoke. He has not had sick contacts. He has not tried anything OTC for his symptoms. There are no PFT's on file or chest xray to review.  Review of Systems      Past Medical History:  Diagnosis Date  . Back pain    frequent  . Childhood asthma    no symptoms as adult.  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 Elevated serum creatinine    history of, likely related to muscle mass and not true renal disease  . Hyperlipidemia   . Knee pain    frequent    Current Outpatient Medications  Medication Sig Dispense Refill  . albuterol (VENTOLIN HFA) 108 (90 Base) MCG/ACT inhaler INHALE 2 PUFFS INTO THE LUNGS EVERY 6 (SIX) HOURS AS NEEDED FOR WHEEZING OR SHORTNESS OF BREATH. 1 Inhaler 3  . cyclobenzaprine (FLEXERIL) 10 MG tablet Take 0.5-1 tablets (5-10 mg total) by mouth 3 (three) times daily as needed for muscle spasms (sedation caution). 30 tablet 0  . Fluticasone-Salmeterol (ADVAIR DISKUS) 250-50 MCG/DOSE AEPB Inhale 1 puff into the lungs 2 (two) times daily. 60 each 2  . ibuprofen (ADVIL,MOTRIN) 600 MG tablet Take 1 tablet (600 mg total) by mouth every 8 (eight) hours as needed. 30 tablet 0  . Multiple Vitamin (MULTIVITAMIN) tablet Take 1 tablet by mouth daily.      . traMADol (ULTRAM) 50 MG tablet TAKE 2 TABLETS BY MOUTH THREE TIMES DAILY AS NEEDED 540 tablet 1   No  current facility-administered medications for this visit.     Allergies  Allergen Reactions  . Meloxicam Other (See Comments)    GI upset    Family History  Problem Relation Age of Onset  . Diabetes Mother   . Cancer Father        prostate, dx'd in his 19's  . Prostate cancer Father   . Alcohol abuse Maternal Uncle   . Stroke Neg Hx   . Depression Neg Hx   . Drug abuse Neg Hx   . Colon cancer Neg Hx     Social History   Socioeconomic History  . Marital status: Married    Spouse name: Not on file  . Number of children: 2  . Years of education: Not on file  . Highest education level: Not on file  Occupational History  . Occupation: [employer], Museum/gallery exhibitions officer  Social Needs  . Financial resource strain: Not on file  . Food insecurity:    Worry: Not on file    Inability: Not on file  . Transportation needs:    Medical: Not on file    Non-medical: Not on file  Tobacco Use  . Smoking status: Never Smoker  . Smokeless tobacco: Never Used  Substance and Sexual Activity  .  Alcohol use: Yes    Alcohol/week: 0.0 oz    Comment: rare  . Drug use: No  . Sexual activity: Not on file  Lifestyle  . Physical activity:    Days per week: Not on file    Minutes per session: Not on file  . Stress: Not on file  Relationships  . Social connections:    Talks on phone: Not on file    Gets together: Not on file    Attends religious service: Not on file    Active member of club or organization: Not on file    Attends meetings of clubs or organizations: Not on file    Relationship status: Not on file  . Intimate partner violence:    Fear of current or ex partner: Not on file    Emotionally abused: Not on file    Physically abused: Not on file    Forced sexual activity: Not on file  Other Topics Concern  . Not on file  Social History Narrative   Lives with wife, has 2 daughters   [place]   Exercise- cardio and weights.   Enjoys  watching bull riding.     Constitutional: Denies fever, malaise, fatigue, headache or abrupt weight changes.  HEENT: Denies eye pain, eye redness, ear pain, ringing in the ears, wax buildup, runny nose, nasal congestion, bloody nose, or sore throat. Respiratory: Pt reports shortness of breath. Denies difficulty breathing, cough or sputum production.   Cardiovascular: Denies chest pain, chest tightness, palpitations or swelling in the hands or feet.   No other specific complaints in a complete review of systems (except as listed in HPI above).  Objective:   Physical Exam   BP 130/84   Pulse 68   Temp 98.1 F (36.7 C) (Oral)   Wt 214 lb (97.1 kg)   SpO2 98%   BMI 30.71 kg/m  Wt Readings from Last 3 Encounters:  03/10/18 214 lb (97.1 kg)  01/18/18 218 lb 4 oz (99 kg)  10/23/17 219 lb 4 oz (99.5 kg)    General: Appears his stated age, well developed, well nourished in NAD. HEENT: Throat/Mouth: Teeth present, mucosa pink and moist, no exudate, lesions or ulcerations noted.  Neck:  No adenopathy noted.  Cardiovascular: Normal rate and rhythm. S1,S2 noted.  No murmur, rubs or gallops noted.  Pulmonary/Chest: Normal effort and positive vesicular breath sounds. No respiratory distress. No wheezes, rales or ronchi noted.    BMET    Component Value Date/Time   NA 139 09/20/2015 1450   K 4.4 09/20/2015 1450   CL 105 09/20/2015 1450   CO2 28 09/20/2015 1450   GLUCOSE 97 09/20/2015 1450   BUN 24 (H) 09/20/2015 1450   CREATININE 1.44 09/20/2015 1450   CALCIUM 9.3 09/20/2015 1450   GFRNONAA 64.31 07/15/2010 0859   GFRAA 77 06/21/2008 1008    Lipid Panel     Component Value Date/Time   CHOL 262 (H) 09/20/2015 1450   TRIG 131.0 09/20/2015 1450   HDL 46.10 09/20/2015 1450   CHOLHDL 6 09/20/2015 1450   VLDL 26.2 09/20/2015 1450   LDLCALC 190 (H) 09/20/2015 1450    CBC    Component Value Date/Time   HGB 15.5 12/29/2008 1202    Hgb A1C No results found for:  HGBA1C         Assessment & Plan:   Shortness of Breath:  Exam benign No indication for chest xray today Could benefit from some  PFT's if not done in the last year, but will let him discuss this with his PCP Advair refilled today Continue Albuterol prn No indication for steroids at this time  Return/Follow Up precautions disucssed Nicki Reaper, NP

## 2018-03-10 NOTE — Telephone Encounter (Signed)
Already filled in another encounter

## 2018-04-08 ENCOUNTER — Other Ambulatory Visit: Payer: Self-pay | Admitting: Internal Medicine

## 2018-06-17 ENCOUNTER — Encounter: Payer: Self-pay | Admitting: Family Medicine

## 2018-06-17 ENCOUNTER — Ambulatory Visit (INDEPENDENT_AMBULATORY_CARE_PROVIDER_SITE_OTHER): Payer: BLUE CROSS/BLUE SHIELD | Admitting: Family Medicine

## 2018-06-17 VITALS — BP 116/80 | HR 66 | Temp 98.4°F | Ht 70.0 in | Wt 214.5 lb

## 2018-06-17 DIAGNOSIS — M542 Cervicalgia: Secondary | ICD-10-CM | POA: Diagnosis not present

## 2018-06-17 DIAGNOSIS — Z23 Encounter for immunization: Secondary | ICD-10-CM | POA: Diagnosis not present

## 2018-06-17 NOTE — Patient Instructions (Addendum)
Try to avoid straining (avoiding looking up for prolonged periods of time).  Try using heat and gently stretch.   Take care.  Glad to see you.  Schedule a physical when you can.

## 2018-06-17 NOTE — Progress Notes (Signed)
R sided neck pain, posterior neck pain and locally puffy at prev lipoma excision site.    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  No FCNAVD.  No specific injury but h/o repetitive motion at work.  No radicular arm sx but he has some occ hand pain.    Meds, vitals, and allergies reviewed.   ROS: Per HPI unless specifically indicated in ROS section   GEN: nad, alert and oriented HEENT: mucous membranes moist NECK: supple w/o LA, not tender to palpation in midline.  He does have a lipoma excision site noted on the right posterior neck.  This appears well-healed.  Not erythematous bruised or puffy there.  Normal neck range of motion. CV: rrr.  no murmur PULM: ctab, no inc wob

## 2018-06-20 DIAGNOSIS — M542 Cervicalgia: Secondary | ICD-10-CM | POA: Insufficient documentation

## 2018-06-20 NOTE — Assessment & Plan Note (Signed)
Likely benign musculoskeletal strain.  No midline pain.  No need for imaging at this point.  No emergent symptoms.  Okay for outpatient follow-up.  Reasonable to try Flexeril as needed.  Can use heat as needed.  Advised to avoid straining.  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  That is a reasonable request.  I asked him to schedule physical when possible.

## 2018-07-01 ENCOUNTER — Other Ambulatory Visit: Payer: Self-pay | Admitting: Family Medicine

## 2018-07-08 ENCOUNTER — Other Ambulatory Visit: Payer: Self-pay | Admitting: Family Medicine

## 2018-07-08 NOTE — Telephone Encounter (Signed)
Sent. Thanks.  I don't think the pharmacy will fill 90 day supply so I sent 30 day supply with refills.

## 2018-07-08 NOTE — Telephone Encounter (Signed)
Electronic refill request. Tramadol Last office visit:   06/17/18 Acute Last Filled:    540 tablet 1 12/24/2017  Please advise.

## 2018-08-03 ENCOUNTER — Telehealth: Payer: Self-pay | Admitting: Family Medicine

## 2018-08-03 ENCOUNTER — Encounter: Payer: Self-pay | Admitting: Family Medicine

## 2018-08-03 ENCOUNTER — Ambulatory Visit (INDEPENDENT_AMBULATORY_CARE_PROVIDER_SITE_OTHER): Payer: BLUE CROSS/BLUE SHIELD | Admitting: Family Medicine

## 2018-08-03 ENCOUNTER — Ambulatory Visit: Payer: BLUE CROSS/BLUE SHIELD | Admitting: Family Medicine

## 2018-08-03 DIAGNOSIS — R6889 Other general symptoms and signs: Secondary | ICD-10-CM

## 2018-08-03 NOTE — Telephone Encounter (Signed)
Left message asking pt to call office please r/s 12/17 appointment with dr tower °

## 2018-08-03 NOTE — Progress Notes (Signed)
duration of symptoms: about 5 days ago Start with ST.  Gargled, tried to rest his voice.  Worked Friday night, then after getting up the next day he felt worse.   Then had HA, felt hot, chills, aches.   Sx continued in the meantime but he was getting a little better.  He was in bed for a few days.  Last night was a better night, he slept better.   This AM is better.  Slight cough but no fevers now.  Scant sputum.   He is drinking plenty of fluids.   He is taking OTC cold meds.    He was sick in the bed for 3 days and that is atypical for him.   He is still fatigued.   Still using his inhalers at baseline.   No wheeze now, prev with some wheeze noted.    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    Per HPI unless specifically indicated in ROS section   Meds, vitals, and allergies reviewed.   GEN: nad, alert and oriented HEENT: mucous membranes moist, TM w/o erythema, nasal epithelium injected, OP with cobblestoning NECK: supple w/o LA CV: rrr. PULM: ctab, no inc wob ABD: soft, +bs EXT: no edema

## 2018-08-03 NOTE — Patient Instructions (Signed)
Presumed flu or flu like illness, you clearly look better in the meantime.  Rest and fluids in the meantime.  Keep using your inhalers.  Take care.  Glad to see you.

## 2018-08-04 DIAGNOSIS — R6889 Other general symptoms and signs: Secondary | ICD-10-CM | POA: Insufficient documentation

## 2018-08-04 NOTE — Assessment & Plan Note (Signed)
Presumed flu or flu like illness, he clearly looks better in the meantime.  Rest and fluids in the meantime.  Keep using his inhalers.  Since he is improved and his lungs are clear it does not make sense to test him for the flu at this point or to start him on antivirals.  Rationale discussed with patient.  He agrees.  Work note given to patient.

## 2018-11-22 ENCOUNTER — Other Ambulatory Visit: Payer: Self-pay

## 2018-11-22 ENCOUNTER — Telehealth: Payer: Self-pay | Admitting: Family Medicine

## 2018-11-22 ENCOUNTER — Ambulatory Visit (INDEPENDENT_AMBULATORY_CARE_PROVIDER_SITE_OTHER): Payer: BLUE CROSS/BLUE SHIELD | Admitting: Family Medicine

## 2018-11-22 DIAGNOSIS — J309 Allergic rhinitis, unspecified: Secondary | ICD-10-CM | POA: Diagnosis not present

## 2018-11-22 MED ORDER — FLUTICASONE PROPIONATE 50 MCG/ACT NA SUSP: 2.0000 | Freq: Every day | NASAL | Status: DC

## 2018-11-22 NOTE — Telephone Encounter (Signed)
Pt called back wanting to know what he needs do for allergies and needs work note for last night  I was able to get pt to agree to DOXY.ME appointment today @ 12:30

## 2018-11-22 NOTE — Telephone Encounter (Signed)
Patient can be reached [phone].

## 2018-11-22 NOTE — Progress Notes (Signed)
Virtual visit completed through WebEx.  Patient location: home  Provider location: [place] at [facility], office   Sx started with inc pollen recently.  Stuffy, HA.  Runny nose. Minimal wheezing.  No fevers, no vomiting.  He has more trouble when outside.  No sputum.  No rash.  He has h/o inc sx in the spring, with more pollen locally.  "This is the norm for me, this time of year."  Using adviar daily but not SABA recently.    He was out of work last night due to sx.  Can likely return to work [employer].    Meds and allergies reviewed.   ROS: Per HPI unless specifically indicated in ROS section   nad Speech wnl Not in resp distress.

## 2018-11-22 NOTE — Telephone Encounter (Signed)
Spoke to pt. Letter placed with lab to give when he comes in a few minutes to pick it up.

## 2018-11-22 NOTE — Telephone Encounter (Signed)
Offered webex appointment pt decline  Needs work note for today Please advise

## 2018-11-22 NOTE — Telephone Encounter (Signed)
Best number [phone] Pt called wanting to see what he could take for his allergies. Stuffy nose headache from pollen He is not taking anything right now.  [facility]

## 2018-11-22 NOTE — Telephone Encounter (Signed)
Patient said his my chart won't begin until tomorrow.  Patient wants to know if he could come by the office to pick up his work note.  He said someone could bring the note to the car.

## 2018-11-22 NOTE — Telephone Encounter (Signed)
See note. Thanks.

## 2018-11-22 NOTE — Assessment & Plan Note (Signed)
With asthma hx noted.  Inc advair to BID for now, continue to rinse after use.  Start flonase OTC.  See orders.    Work note for 11/21/2018.   Hold off on prednisone course for now, he'll update me as needed.   He agrees. With plan.

## 2019-01-17 ENCOUNTER — Other Ambulatory Visit: Payer: Self-pay | Admitting: Family Medicine

## 2019-01-17 NOTE — Telephone Encounter (Signed)
Electronic refill request. Tramadol Last office visit:   11/22/2018 Last Filled:    180 tablet 5 07/08/2018  Please advise.

## 2019-01-18 NOTE — Telephone Encounter (Signed)
Sent. Thanks.   

## 2019-01-27 ENCOUNTER — Other Ambulatory Visit: Payer: Self-pay

## 2019-01-27 ENCOUNTER — Encounter: Payer: Self-pay | Admitting: Family Medicine

## 2019-01-27 ENCOUNTER — Ambulatory Visit (INDEPENDENT_AMBULATORY_CARE_PROVIDER_SITE_OTHER): Payer: BC Managed Care – PPO | Admitting: Family Medicine

## 2019-01-27 DIAGNOSIS — M545 Low back pain, unspecified: Secondary | ICD-10-CM

## 2019-01-27 MED ORDER — PREDNISONE 20 MG PO TABS: ORAL_TABLET | ORAL | 0 refills | Status: DC

## 2019-01-27 MED ORDER — TIZANIDINE HCL 4 MG PO TABS: 4.0000 mg | ORAL_TABLET | Freq: Four times a day (QID) | ORAL | 0 refills | Status: DC | PRN

## 2019-01-27 NOTE — Patient Instructions (Signed)
Alternate ice and heat as needed.  Prednisone with food.  Sedation caution on tizanidine.  Out of work for now.  Gently stretch, don't lift.  Take care.  Glad to see you.

## 2019-01-27 NOTE — Progress Notes (Signed)
He is masking at work.  Working day shifts.  Pandemic considerations d/w pt.   Lower back pain.  Was grabbing a 20 lbs weight yesterday at the start of working out at the gym and felt a cracking in his back "and it was done", meaning the workout was over.  This was a typical work out for patient, other than not stretching much prior to lifting.  More pain last night.  R lateral back, not midline, superior to the belt line.  No dysuria.  No FCNAVD.  He was able to stay up when the pain started.  More pain sitting and laying down.  More pain as the day went on.  Some pain down the R leg but that is better now.  No L sided sx.  No rash and no bruising.  No trauma o/w.    Meds, vitals, and allergies reviewed.   ROS: Per HPI unless specifically indicated in ROS section   nad ncat rrr ctab Back not ttp in midline but right lower back is tender to palpation. Neurovascularly grossly intact the bilateral lower extremities. No bruising.  Left straight leg raise negative.  Right straight leg raise positive Able to bear weight.

## 2019-01-30 NOTE — Assessment & Plan Note (Signed)
Advised the following- Alternate ice and heat as needed.  Prednisone with food.  Routine steroid cautions discussed with patient. Sedation caution on tizanidine.  Out of work for now.  Gently stretch, don't lift.  Update me as needed.  He agrees.  Imaging at this point not needed as it would likely not change management and he is still okay for outpatient follow-up.

## 2019-05-09 ENCOUNTER — Other Ambulatory Visit: Payer: Self-pay | Admitting: Family Medicine

## 2019-06-09 ENCOUNTER — Ambulatory Visit: Payer: BC Managed Care – PPO | Admitting: Family Medicine

## 2019-07-18 ENCOUNTER — Other Ambulatory Visit: Payer: Self-pay | Admitting: Family Medicine

## 2019-07-18 NOTE — Telephone Encounter (Signed)
Electronic refill request. Tramadol Last office visit:   01/27/2019 Last Filled:    180 tablet 5 01/18/2019  Please advise.

## 2019-07-19 NOTE — Telephone Encounter (Signed)
Sent. Thanks.   

## 2019-09-06 ENCOUNTER — Other Ambulatory Visit: Payer: Self-pay

## 2019-09-06 ENCOUNTER — Ambulatory Visit (INDEPENDENT_AMBULATORY_CARE_PROVIDER_SITE_OTHER): Payer: BC Managed Care – PPO | Admitting: Family Medicine

## 2019-09-06 ENCOUNTER — Ambulatory Visit (INDEPENDENT_AMBULATORY_CARE_PROVIDER_SITE_OTHER)
Admission: RE | Admit: 2019-09-06 | Discharge: 2019-09-06 | Disposition: A | Discharge status: Home / Self Care | Payer: BC Managed Care – PPO | Source: Ambulatory Visit | Attending: Family Medicine | Admitting: Family Medicine

## 2019-09-06 ENCOUNTER — Encounter: Payer: Self-pay | Admitting: Family Medicine

## 2019-09-06 VITALS — BP 138/72 | HR 73 | Temp 96.8°F | Ht 70.0 in | Wt 217.6 lb

## 2019-09-06 DIAGNOSIS — M79671 Pain in right foot: Secondary | ICD-10-CM

## 2019-09-06 DIAGNOSIS — M7989 Other specified soft tissue disorders: Secondary | ICD-10-CM | POA: Diagnosis not present

## 2019-09-06 DIAGNOSIS — R600 Localized edema: Secondary | ICD-10-CM | POA: Diagnosis not present

## 2019-09-06 MED ORDER — CEPHALEXIN 500 MG PO CAPS: 500.0000 mg | ORAL_CAPSULE | Freq: Four times a day (QID) | ORAL | 0 refills | Status: DC

## 2019-09-06 NOTE — Progress Notes (Signed)
This visit occurred during the SARS-CoV-2 public health emergency.  Safety protocols were in place, including screening questions prior to the visit, additional usage of staff PPE, and extensive cleaning of exam room while observing appropriate contact time as indicated for disinfecting solutions.  Foot eval.  Distal R foot 5th ray tender.  R 4th and 5th toes ttp, swollen, some paresthesias locally but not absence of sensation.  Sx started about 2 days ago.  No injury.  No L foot sx.  No hand sx. No h/o gout.  Pain walking.   Meds, vitals, and allergies reviewed.   ROS: Per HPI unless specifically indicated in ROS section   nad ncat Right foot with normal inspection except for slight puffiness and minimal erythema on the fourth and fifth toe dorsally.  He has some irritation in the fourth webspace that looks like he could have some chronic fungal changes there without any ulceration.  The distal fourth and fifth metatarsals are slightly tender.  No draining pus.  No sign of abscess locally.  Intact dorsalis pedis pulse.  Foot not tender otherwise.  Able to bear weight.  X-ray negative.  Discussed with patient at office visit.

## 2019-09-06 NOTE — Patient Instructions (Signed)
Start keflex.  Use OTC lamisil cream for now.  Update me as needed.  Take care.  Glad to see you.

## 2019-09-08 DIAGNOSIS — M79671 Pain in right foot: Secondary | ICD-10-CM | POA: Insufficient documentation

## 2019-09-08 NOTE — Assessment & Plan Note (Addendum)
No sign of fracture on x-ray.  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  He can use topical over-the-counter antifungal cream such as Lamisil and start Keflex in the meantime.  Routine cautions given to patient.  Work note given to patient.  He will update me as needed.  We talked about him scheduling a physical when possible.

## 2019-09-29 ENCOUNTER — Telehealth: Payer: Self-pay

## 2019-09-29 NOTE — Telephone Encounter (Signed)
LVM w COVID screen, back lab and front door info 2.11.2021 TLJ  

## 2019-10-03 ENCOUNTER — Other Ambulatory Visit: Payer: Self-pay

## 2019-10-03 ENCOUNTER — Other Ambulatory Visit (INDEPENDENT_AMBULATORY_CARE_PROVIDER_SITE_OTHER): Payer: BC Managed Care – PPO

## 2019-10-03 DIAGNOSIS — Z125 Encounter for screening for malignant neoplasm of prostate: Secondary | ICD-10-CM

## 2019-10-03 DIAGNOSIS — E785 Hyperlipidemia, unspecified: Secondary | ICD-10-CM

## 2019-10-03 DIAGNOSIS — Z1159 Encounter for screening for other viral diseases: Secondary | ICD-10-CM | POA: Diagnosis not present

## 2019-10-03 DIAGNOSIS — Z119 Encounter for screening for infectious and parasitic diseases, unspecified: Secondary | ICD-10-CM

## 2019-10-03 LAB — COMPREHENSIVE METABOLIC PANEL
ALT: 23 U/L (ref 0–53)
AST: 19 U/L (ref 0–37)
Albumin: 4.1 g/dL (ref 3.5–5.2)
Alkaline Phosphatase: 29 U/L — ABNORMAL LOW (ref 39–117)
BUN: 18 mg/dL (ref 6–23)
CO2: 28 mEq/L (ref 19–32)
Calcium: 9.5 mg/dL (ref 8.4–10.5)
Chloride: 106 mEq/L (ref 96–112)
Creatinine, Ser: 1.4 mg/dL (ref 0.40–1.50)
GFR: 63.62 mL/min (ref >60.00)
Glucose, Bld: 98 mg/dL (ref 70–99)
Potassium: 4.5 mEq/L (ref 3.5–5.1)
Sodium: 140 mEq/L (ref 135–145)
Total Bilirubin: 0.5 mg/dL (ref 0.2–1.2)
Total Protein: 6.3 g/dL (ref 6.0–8.3)

## 2019-10-03 LAB — PSA: PSA: 0.63 ng/mL (ref 0.10–4.00)

## 2019-10-03 LAB — LIPID PANEL
Cholesterol: 278 mg/dL — ABNORMAL HIGH (ref 0–200)
HDL: 56.8 mg/dL (ref >39.00)
LDL Cholesterol: 200 mg/dL — ABNORMAL HIGH (ref 0–99)
NonHDL: 220.91
Total CHOL/HDL Ratio: 5
Triglycerides: 107 mg/dL (ref 0.0–149.0)
VLDL: 21.4 mg/dL (ref 0.0–40.0)

## 2019-10-04 LAB — HIV ANTIBODY (ROUTINE TESTING W REFLEX): HIV 1&2 Ab, 4th Generation: NONREACTIVE

## 2019-10-04 LAB — HEPATITIS C ANTIBODY
Hepatitis C Ab: NONREACTIVE
SIGNAL TO CUT-OFF: 0.01 (ref <1.00)

## 2019-10-10 ENCOUNTER — Ambulatory Visit (INDEPENDENT_AMBULATORY_CARE_PROVIDER_SITE_OTHER): Payer: BC Managed Care – PPO | Admitting: Family Medicine

## 2019-10-10 ENCOUNTER — Encounter: Payer: Self-pay | Admitting: Family Medicine

## 2019-10-10 ENCOUNTER — Other Ambulatory Visit: Payer: Self-pay

## 2019-10-10 ENCOUNTER — Other Ambulatory Visit: Payer: Self-pay | Admitting: *Deleted

## 2019-10-10 VITALS — BP 122/64 | HR 77 | Temp 96.0°F | Ht 70.0 in | Wt 218.2 lb

## 2019-10-10 DIAGNOSIS — M25562 Pain in left knee: Secondary | ICD-10-CM

## 2019-10-10 DIAGNOSIS — Z0000 Encounter for general adult medical examination without abnormal findings: Secondary | ICD-10-CM

## 2019-10-10 DIAGNOSIS — E785 Hyperlipidemia, unspecified: Secondary | ICD-10-CM

## 2019-10-10 DIAGNOSIS — Z7189 Other specified counseling: Secondary | ICD-10-CM

## 2019-10-10 DIAGNOSIS — M25561 Pain in right knee: Secondary | ICD-10-CM

## 2019-10-10 DIAGNOSIS — R7989 Other specified abnormal findings of blood chemistry: Secondary | ICD-10-CM

## 2019-10-10 DIAGNOSIS — M545 Low back pain, unspecified: Secondary | ICD-10-CM

## 2019-10-10 DIAGNOSIS — J45909 Unspecified asthma, uncomplicated: Secondary | ICD-10-CM

## 2019-10-10 DIAGNOSIS — Z1211 Encounter for screening for malignant neoplasm of colon: Secondary | ICD-10-CM | POA: Diagnosis not present

## 2019-10-10 DIAGNOSIS — Z23 Encounter for immunization: Secondary | ICD-10-CM | POA: Diagnosis not present

## 2019-10-10 MED ORDER — TIZANIDINE HCL 4 MG PO TABS: 4.0000 mg | ORAL_TABLET | Freq: Four times a day (QID) | ORAL | 1 refills | Status: DC | PRN

## 2019-10-10 MED ORDER — FLUTICASONE-SALMETEROL 250-50 MCG/DOSE IN AEPB: INHALATION_SPRAY | RESPIRATORY_TRACT | 3 refills | Status: DC

## 2019-10-10 MED ORDER — ALBUTEROL SULFATE HFA 108 (90 BASE) MCG/ACT IN AERS: INHALATION_SPRAY | RESPIRATORY_TRACT | 3 refills | Status: DC

## 2019-10-10 NOTE — Progress Notes (Signed)
This visit occurred during the SARS-CoV-2 public health emergency.  Safety protocols were in place, including screening questions prior to the visit, additional usage of staff PPE, and extensive cleaning of exam room while observing appropriate contact time as indicated for disinfecting solutions.  CPE- See plan.  Routine anticipatory guidance given to patient.  See health maintenance.  The possibility exists that previously documented standard health maintenance information may have been brought forward from a previous encounter into this note.  If needed, that same information has been updated to reflect the current situation based on today's encounter.    Tetanus 2021.   Flu 2020 PNA due at 63.  Shingles d/w pt.   covid vaccine d/w pt.   PSA wnl.   D/w patient [id number] for colon cancer screening, including IFOB vs. colonoscopy.  Risks and benefits of both were discussed and patient voiced understanding.  Pt elects for: cologuard.   Living will d/w pt.  Wife designated if patient were incapacitated.   HIV and HCV neg.    Lipids d/w pt.  The 10-year ASCVD risk score Denman George DC Montez Hageman., et al., 2013) is: 6.5%   Values used to calculate the score:     Age: 54 years     Sex: Male     Is Non-Hispanic African American: Yes     Diabetic: No     Tobacco smoker: No     Systolic Blood Pressure: 122 mmHg     Is BP treated: No     HDL Cholesterol: 56.8 mg/dL     Total Cholesterol: 278 mg/dL  Asthma.  occ SABA use overall, depending on the weather.  Still on advair and doing well overall.  Minimal wheeze, weather dependent.    Knee pain, with relief from tramadol.  nsaid cautions d/w pt.  Cr stable, and likely underestimates his GFR, d/w pt.  He had a flare of back pain with weather change.  He was leaning down and picking up a small box and then he has R lower back pain.  No leg pain.  No weakness.  Pain started last week.  He has been stretching at baseline. No dysuria.    PMH and SH  reviewed  Meds, vitals, and allergies reviewed.   ROS: Per HPI.  Unless specifically indicated otherwise in HPI, the patient denies:  General: fever. Eyes: acute vision changes ENT: sore throat Cardiovascular: chest pain Respiratory: SOB GI: vomiting GU: dysuria Musculoskeletal: acute back pain Derm: acute rash Neuro: acute motor dysfunction Psych: worsening mood Endocrine: polydipsia Heme: bleeding Allergy: hayfever  GEN: nad, alert and oriented HEENT: mucous membranes moist NECK: supple w/o LA CV: rrr. PULM: ctab, no inc wob ABD: soft, +bs EXT: no edema SKIN: no acute rash Right lower back tender to palpation but able to bear weight.  Midline not tender to palpation.

## 2019-10-10 NOTE — Patient Instructions (Addendum)
Check with your insurance to see if they will cover the shingles shot. Tetanus shot today.  We'll get cologuard set up.  Use the muscle relaxer as needed.  Update me as needed.   Work on cardio and recheck lipids at a fasting lab visit in about 3-4 months.   Take care.  Glad to see you.

## 2019-10-12 NOTE — Assessment & Plan Note (Signed)
No emergent symptoms at this point.  Continue tramadol and tizanidine.  Sedation cautions discussed with patient.  Work note given to patient.  He will gently stretch and update me if not getting better.  He agrees.

## 2019-10-12 NOTE — Assessment & Plan Note (Signed)
He will keep working on diet and exercise and recheck lipids in a few months.  He agrees.

## 2019-10-12 NOTE — Assessment & Plan Note (Signed)
occ SABA use overall, depending on the weather.  Still on advair and doing well overall.  Minimal wheeze, weather dependent.   Lungs are clear.  Continue as is.

## 2019-10-12 NOTE — Assessment & Plan Note (Signed)
Tetanus 2021.   Flu 2020 PNA due at 63.  Shingles d/w pt.   covid vaccine d/w pt.   PSA wnl.   D/w patient [id number] for colon cancer screening, including IFOB vs. colonoscopy.  Risks and benefits of both were discussed and patient voiced understanding.  Pt elects for: cologuard.   Living will d/w pt.  Wife designated if patient were incapacitated.   HIV and HCV neg.

## 2019-10-12 NOTE — Assessment & Plan Note (Signed)
Living will d/w pt.  Wife designated if patient were incapacitated.   ?

## 2019-10-12 NOTE — Assessment & Plan Note (Signed)
His creatinine is stable and given his muscle mass I think his creatinine elevation is likely related to overall muscle mass/frequent exercise, not true renal disease.  Discussed with patient.

## 2019-10-12 NOTE — Assessment & Plan Note (Signed)
Continue as needed tramadol. 

## 2019-10-13 NOTE — Addendum Note (Signed)
Addended by: Annamarie Major on: 10/13/2019 05:07 PM   Modules accepted: Orders

## 2019-11-28 DIAGNOSIS — Z1211 Encounter for screening for malignant neoplasm of colon: Secondary | ICD-10-CM | POA: Diagnosis not present

## 2019-12-02 LAB — EXTERNAL GENERIC LAB PROCEDURE: COLOGUARD: NEGATIVE

## 2019-12-02 LAB — COLOGUARD
COLOGUARD: NEGATIVE
Cologuard: NEGATIVE

## 2019-12-05 ENCOUNTER — Encounter: Payer: Self-pay | Admitting: Family Medicine

## 2020-01-17 ENCOUNTER — Telehealth (INDEPENDENT_AMBULATORY_CARE_PROVIDER_SITE_OTHER): Payer: BC Managed Care – PPO | Admitting: Family Medicine

## 2020-01-17 ENCOUNTER — Encounter: Payer: Self-pay | Admitting: Family Medicine

## 2020-01-17 DIAGNOSIS — M25562 Pain in left knee: Secondary | ICD-10-CM

## 2020-01-17 DIAGNOSIS — K219 Gastro-esophageal reflux disease without esophagitis: Secondary | ICD-10-CM

## 2020-01-17 DIAGNOSIS — N529 Male erectile dysfunction, unspecified: Secondary | ICD-10-CM | POA: Diagnosis not present

## 2020-01-17 DIAGNOSIS — M25561 Pain in right knee: Secondary | ICD-10-CM

## 2020-01-17 MED ORDER — FAMOTIDINE 20 MG PO TABS: 20.0000 mg | ORAL_TABLET | Freq: Two times a day (BID) | ORAL | Status: DC

## 2020-01-17 MED ORDER — SILDENAFIL CITRATE 20 MG PO TABS: 20.0000 mg | ORAL_TABLET | Freq: Every day | ORAL | 12 refills | Status: DC | PRN

## 2020-01-17 MED ORDER — TRAMADOL HCL 50 MG PO TABS: ORAL_TABLET | ORAL | 5 refills | Status: DC

## 2020-01-17 NOTE — Progress Notes (Signed)
Virtual visit completed through WebEx or similar program Patient location: home  Provider location: [place] at [facility], office  Participants: Patient and me (unless stated otherwise below)  Pandemic considerations d/w pt.   Limitations and rationale for visit method d/w patient.  Patient agreed to proceed.   CC: ED and ST.      HPI: ST initially, started a few weeks ago.  More at night.  Never in the day.  He thought it could be from GERD.  Working day shift.  No voice changes.  He had some food sticking with swallowing, moving slowly.  Never had had food impaction.  No rhinorrhea, no fevers.  No vomiting, no abd pain.  No blood in stool or urine.  No black stools.  Not taking ibuprofen frequently, only a few doses recently.   No recent PPI use.  He has some pepcid, recently started, it helped a lot so far.  He slept propped up and did better last night.    ED d/w pt.  No NTG use.  We talked about medication options.  He needed refill on tramadol for knee pain.  Used with relief.  No ADE on med.   Meds and allergies reviewed.   ROS: Per HPI unless specifically indicated in ROS section   NAD Speech wnl  A/P: Likely GERD, causing sore throat at night.  Discussed options. Elevated head of bed.  Can take pepcid 20mg  BID.  If any residual sx or dysphagia, then refer to GI.  Await input from patient.  He agrees.    ED d/w pt.  No NTG use.  Okay to use sildenafil with routine cautions.  He agrees.  Update me as needed.  He needed refill on tramadol for knee pain.  Used with relief.  No ADE on med.  rx sent.

## 2020-01-19 ENCOUNTER — Telehealth: Payer: Self-pay | Admitting: *Deleted

## 2020-01-19 DIAGNOSIS — N529 Male erectile dysfunction, unspecified: Secondary | ICD-10-CM | POA: Insufficient documentation

## 2020-01-19 DIAGNOSIS — K219 Gastro-esophageal reflux disease without esophagitis: Secondary | ICD-10-CM | POA: Insufficient documentation

## 2020-01-19 NOTE — Assessment & Plan Note (Signed)
  He needed refill on tramadol for knee pain.  Used with relief.  No ADE on med.  rx sent.

## 2020-01-19 NOTE — Telephone Encounter (Signed)
Received fax from [facility] requesting PA for Sildenafil 20 mg.  Completed PA on CoverMyMeds.  Patient insurance plan does not cover this medication for ED.  Patient will need to pay out of pocket for medication.

## 2020-01-19 NOTE — Assessment & Plan Note (Signed)
  ED d/w pt.  No NTG use.  Okay to use sildenafil with routine cautions.  He agrees.  Update me as needed.

## 2020-01-19 NOTE — Assessment & Plan Note (Signed)
Likely GERD, causing sore throat at night.  Discussed options. Elevated head of bed.  Can take pepcid 20mg  BID.  If any residual sx or dysphagia, then refer to GI.  Await input from patient.  He agrees.

## 2020-02-13 ENCOUNTER — Other Ambulatory Visit: Payer: Self-pay | Admitting: Family Medicine

## 2020-02-13 NOTE — Telephone Encounter (Signed)
Electronic refill request. Tramadol Last office visit:   10/10/2019 Last Filled:    180 tablet 5 01/17/2020  This request is for a new prescription for a controlled substance as required by Federal/State law.

## 2020-02-14 NOTE — Telephone Encounter (Signed)
This was already sent on 01/17/20.  Can they use that rx?

## 2020-05-07 ENCOUNTER — Ambulatory Visit: Payer: BC Managed Care – PPO | Admitting: Family Medicine

## 2020-05-08 ENCOUNTER — Other Ambulatory Visit: Payer: Self-pay

## 2020-05-08 ENCOUNTER — Ambulatory Visit (INDEPENDENT_AMBULATORY_CARE_PROVIDER_SITE_OTHER): Payer: BC Managed Care – PPO | Admitting: Family Medicine

## 2020-05-08 ENCOUNTER — Encounter: Payer: Self-pay | Admitting: Family Medicine

## 2020-05-08 VITALS — BP 150/80 | HR 75 | Temp 97.1°F | Ht 70.0 in | Wt 223.1 lb

## 2020-05-08 DIAGNOSIS — Z23 Encounter for immunization: Secondary | ICD-10-CM | POA: Diagnosis not present

## 2020-05-08 DIAGNOSIS — M542 Cervicalgia: Secondary | ICD-10-CM | POA: Diagnosis not present

## 2020-05-08 NOTE — Progress Notes (Signed)
This visit occurred during the SARS-CoV-2 public health emergency.  Safety protocols were in place, including screening questions prior to the visit, additional usage of staff PPE, and extensive cleaning of exam room while observing appropriate contact time as indicated for disinfecting solutions.  Flu shot today.  Done at OV.    Posterior R sided neck pain.  Unclear if he slept wrong.  Locally puffy initially but better now.  All on the R side.  No L sided pain, no anterior pain.  No rash.  No FCNAV.  No scalp pain.  No arm pain.  Normal grip.  No trauma o/w.  Going on for about 1 week.  He was out of work on 9/17 and 9/18.  No recent muscle relaxer use.    Meds, vitals, and allergies reviewed.   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  No midline pain.   Likely small lipoma on the R upper posterior neck.  Right superior trapezius is tender to palpation without rash or bruising. Normal shoulder range of motion bilaterally

## 2020-05-08 NOTE — Patient Instructions (Addendum)
You may need to change pillows.   Try ice and head.  Likely lipoma that we can send you to get removed if needed.  Use tizanidine if needed for pain, sedation caution.   Take care.  Glad to see you.

## 2020-05-09 NOTE — Assessment & Plan Note (Signed)
Likely benign muscle strain.   Discussed options.  he may need to change pillows.   Try ice and head.  Likely lipoma that we can send him to get removed if needed.  Likely incidental.   Use tizanidine if needed for pain, sedation caution.  He agrees with plan.  He will update me as needed.

## 2020-07-16 ENCOUNTER — Other Ambulatory Visit: Payer: Self-pay | Admitting: Family Medicine

## 2020-07-17 NOTE — Telephone Encounter (Signed)
Please Advise

## 2020-07-18 NOTE — Telephone Encounter (Signed)
Sent. Thanks.   

## 2020-09-17 ENCOUNTER — Other Ambulatory Visit: Payer: Self-pay | Admitting: Internal Medicine

## 2020-09-17 ENCOUNTER — Ambulatory Visit (INDEPENDENT_AMBULATORY_CARE_PROVIDER_SITE_OTHER): Payer: BC Managed Care – PPO | Admitting: Internal Medicine

## 2020-09-17 ENCOUNTER — Other Ambulatory Visit: Payer: Self-pay

## 2020-09-17 ENCOUNTER — Encounter: Payer: Self-pay | Admitting: Internal Medicine

## 2020-09-17 DIAGNOSIS — K219 Gastro-esophageal reflux disease without esophagitis: Secondary | ICD-10-CM | POA: Diagnosis not present

## 2020-09-17 MED ORDER — FAMOTIDINE 20 MG PO TABS: 20.0000 mg | ORAL_TABLET | Freq: Every day | ORAL | 1 refills | Status: AC

## 2020-09-17 NOTE — Patient Instructions (Signed)
Food Choices for Gastroesophageal Reflux Disease, Adult When you have gastroesophageal reflux disease (GERD), the foods you eat and your eating habits are very important. Choosing the right foods can help ease your discomfort. Think about working with a food expert (dietitian) to help you make good choices. What are tips for following this plan? Reading food labels  Look for foods that are low in saturated fat. Foods that may help with your symptoms include: ? Foods that have less than 5% of daily value (DV) of fat. ? Foods that have 0 grams of trans fat. Cooking  Do not fry your food.  Cook your food by baking, steaming, grilling, or broiling. These are all methods that do not need a lot of fat for cooking.  To add flavor, try to use herbs that are low in spice and acidity. Meal planning  Choose healthy foods that are low in fat, such as: ? Fruits and vegetables. ? Whole grains. ? Low-fat dairy products. ? Lean meats, fish, and poultry.  Eat small meals often instead of eating 3 large meals each day. Eat your meals slowly in a place where you are relaxed. Avoid bending over or lying down until 2-3 hours after eating.  Limit high-fat foods such as fatty meats or fried foods.  Limit your intake of fatty foods, such as oils, butter, and shortening.  Avoid the following as told by your doctor: ? Foods that cause symptoms. These may be different for different people. Keep a food diary to keep track of foods that cause symptoms. ? Alcohol. ? Drinking a lot of liquid with meals. ? Eating meals during the 2-3 hours before bed.   Lifestyle  Stay at a healthy weight. Ask your doctor what weight is healthy for you. If you need to lose weight, work with your doctor to do so safely.  Exercise for at least 30 minutes on 5 or more days each week, or as told by your doctor.  Wear loose-fitting clothes.  Do not smoke or use any products that contain nicotine or tobacco. If you need help  quitting, ask your doctor.  Sleep with the head of your bed higher than your feet. Use a wedge under the mattress or blocks under the bed frame to raise the head of the bed.  Chew sugar-free gum after meals. What foods should eat? Eat a healthy, well-balanced diet of fruits, vegetables, whole grains, low-fat dairy products, lean meats, fish, and poultry. Each person is different. Foods that may cause symptoms in one person may not cause any symptoms in another person. Work with your doctor to find foods that are safe for you. The items listed above may not be a complete list of what you can eat and drink. Contact a food expert for more options.   What foods should I avoid? Limiting some of these foods may help in managing the symptoms of GERD. Everyone is different. Talk with a food expert or your doctor to help you find the exact foods to avoid, if any. Fruits Any fruits prepared with added fat. Any fruits that cause symptoms. For some people, this may include citrus fruits, such as oranges, grapefruit, pineapple, and lemons. Vegetables Deep-fried vegetables. French fries. Any vegetables prepared with added fat. Any vegetables that cause symptoms. For some people, this may include tomatoes and tomato products, chili peppers, onions and garlic, and horseradish. Grains Pastries or quick breads with added fat. Meats and other proteins High-fat meats, such as fatty beef or pork,   hot dogs, ribs, ham, sausage, salami, and bacon. Fried meat or protein, including fried fish and fried chicken. Nuts and nut butters, in large amounts. Dairy Whole milk and chocolate milk. Sour cream. Cream. Ice cream. Cream cheese. Milkshakes. Fats and oils Butter. Margarine. Shortening. Ghee. Beverages Coffee and tea, with or without caffeine. Carbonated beverages. Sodas. Energy drinks. Fruit juice made with acidic fruits, such as orange or grapefruit. Tomato juice. Alcoholic drinks. Sweets and desserts Chocolate and  cocoa. Donuts. Seasonings and condiments Pepper. Peppermint and spearmint. Added salt. Any condiments, herbs, or seasonings that cause symptoms. For some people, this may include curry, hot sauce, or vinegar-based salad dressings. The items listed above may not be a complete list of what you should not eat and drink. Contact a food expert for more options. Questions to ask your doctor Diet and lifestyle changes are often the first steps that are taken to manage symptoms of GERD. If diet and lifestyle changes do not help, talk with your doctor about taking medicines. Where to find more information  International Foundation for Gastrointestinal Disorders: aboutgerd.org Summary  When you have GERD, food and lifestyle choices are very important in easing your symptoms.  Eat small meals often instead of 3 large meals a day. Eat your meals slowly and in a place where you are relaxed.  Avoid bending over or lying down until 2-3 hours after eating.  Limit high-fat foods such as fatty meats or fried foods. This information is not intended to replace advice given to you by your health care provider. Make sure you discuss any questions you have with your health care provider. Document Revised: 02/13/2020 Document Reviewed: 02/13/2020 Elsevier Patient Education  2021 [employer]  

## 2020-09-17 NOTE — Progress Notes (Signed)
Subjective:    Patient ID: Randy King, male    DOB: 07/20/1966, 55 y.o.   MRN: [id number]  HPI  Pt presents to the clinic today with c/o reflux. He reports this has been an intermittent issue for the last 9 months. He reports intermittent substernal chest pain which he describes as burning. His symptoms seem worse at night. He noticed this is triggered by dry wine and meats. He has tried Famotidine 20 mg at bedtime as needed with some relief of symptoms. There is no upper GI on file.   Review of Systems      Past Medical History:  Diagnosis Date  . Back pain    frequent  . Childhood asthma    no symptoms as adult.  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 Elevated serum creatinine    history of, likely related to muscle mass and not true renal disease  . Hyperlipidemia   . Knee pain    frequent    Current Outpatient Medications  Medication Sig Dispense Refill  . albuterol (VENTOLIN HFA) 108 (90 Base) MCG/ACT inhaler INHALE 2 PUFFS INTO THE LUNGS EVERY 6 (SIX) HOURS AS NEEDED FOR WHEEZING OR SHORTNESS OF BREATH. 18 g 3  . famotidine (PEPCID) 20 MG tablet Take 1 tablet (20 mg total) by mouth 2 (two) times daily.    . fluticasone (FLONASE) 50 MCG/ACT nasal spray Place 2 sprays into both nostrils daily.    . Fluticasone-Salmeterol (ADVAIR DISKUS) 250-50 MCG/DOSE AEPB TAKE 1 PUFF BY MOUTH TWICE A DAY 180 each 3  . ibuprofen (ADVIL,MOTRIN) 600 MG tablet Take 1 tablet (600 mg total) by mouth every 8 (eight) hours as needed. 30 tablet 0  . Multiple Vitamin (MULTIVITAMIN) tablet Take 1 tablet by mouth daily.      . sildenafil (REVATIO) 20 MG tablet Take 1-5 tablets (20-100 mg total) by mouth daily as needed. 50 tablet 12  . tiZANidine (ZANAFLEX) 4 MG tablet Take 1 tablet (4 mg total) by mouth every 6 (six) hours as needed for muscle spasms (sedation caution). 30 tablet 1  . traMADol (ULTRAM) 50 MG tablet TAKE 2 TABLETS BY MOUTH 3 TIMES A DAY. 180 tablet 5   No  current facility-administered medications for this visit.    Allergies  Allergen Reactions  . Meloxicam Other (See Comments)    GI upset    Family History  Problem Relation Age of Onset  . Diabetes Mother   . Cancer Father        prostate, dx'd in his 12's  . Prostate cancer Father   . Alcohol abuse Maternal Uncle   . Stroke Neg Hx   . Depression Neg Hx   . Drug abuse Neg Hx   . Colon cancer Neg Hx     Social History   Socioeconomic History  . Marital status: Married    Spouse name: Not on file  . Number of children: 2  . Years of education: Not on file  . Highest education level: Not on file  Occupational History  . Occupation: [employer], Museum/gallery exhibitions officer  Tobacco Use  . Smoking status: Never Smoker  . Smokeless tobacco: Never Used  Substance and Sexual Activity  . Alcohol use: Yes    Alcohol/week: 0.0 standard drinks    Comment: rare  . Drug use: No  . Sexual activity: Not on file  Other Topics Concern  . Not on file  Social History Narrative  Lives with wife, has 2 daughters   From [place], in [place] since 2004   Exercise- cardio and weights.   Enjoys watching bull riding.   Social Determinants of Health   Financial Resource Strain: Not on file  Food Insecurity: Not on file  Transportation Needs: Not on file  Physical Activity: Not on file  Stress: Not on file  Social Connections: Not on file  Intimate Partner Violence: Not on file     Constitutional: Denies fever, malaise, fatigue, headache or abrupt weight changes.  Respiratory: Denies difficulty breathing, shortness of breath, cough or sputum production.   Cardiovascular: Denies chest pain, chest tightness, palpitations or swelling in the hands or feet.  Gastrointestinal: Pt reports reflux. Denies abdominal pain, bloating, constipation, diarrhea or blood in the stool.    No other specific complaints in a complete review of systems (except as listed in HPI  above).  Objective:   Physical Exam  BP 132/86   Pulse 77   Temp 97.7 F (36.5 C) (Temporal)   Wt 224 lb (101.6 kg)   SpO2 98%   BMI 32.14 kg/m   Wt Readings from Last 3 Encounters:  05/08/20 223 lb 2 oz (101.2 kg)  01/17/20 216 lb (98 kg)  10/10/19 218 lb 4 oz (99 kg)    General: Appears his stated age, obese, in NAD. Cardiovascular: Normal rate and rhythm. S1,S2 noted.  No murmur, rubs or gallops noted.  Pulmonary/Chest: Normal effort and positive vesicular breath sounds. No respiratory distress. No wheezes, rales or ronchi noted.  Abdomen: Soft and nontender. Normal bowel sounds. No distention or masses noted. Liver, spleen and kidneys non palpable. Neurological: Alert and oriented.   BMET    Component Value Date/Time   NA 140 10/03/2019 0818   K 4.5 10/03/2019 0818   CL 106 10/03/2019 0818   CO2 28 10/03/2019 0818   GLUCOSE 98 10/03/2019 0818   BUN 18 10/03/2019 0818   CREATININE 1.40 10/03/2019 0818   CALCIUM 9.5 10/03/2019 0818   GFRNONAA 64.31 07/15/2010 0859   GFRAA 77 06/21/2008 1008    Lipid Panel     Component Value Date/Time   CHOL 278 (H) 10/03/2019 0818   TRIG 107.0 10/03/2019 0818   HDL 56.80 10/03/2019 0818   CHOLHDL 5 10/03/2019 0818   VLDL 21.4 10/03/2019 0818   LDLCALC 200 (H) 10/03/2019 0818    CBC    Component Value Date/Time   HGB 15.5 12/29/2008 1202    Hgb A1C No results found for: HGBA1C         Assessment & Plan:     Nicki Reaper, NP This visit occurred during the SARS-CoV-2 public health emergency.  Safety protocols were in place, including screening questions prior to the visit, additional usage of staff PPE, and extensive cleaning of exam room while observing appropriate contact time as indicated for disinfecting solutions.

## 2020-09-17 NOTE — Assessment & Plan Note (Signed)
Persistent Encouraged him to take Famotidine 20 mg PO QHS for acid suppression Try to avoid foods that trigger your reflux Avoid eating late and laying down  Notify me or PCP if symptoms persist or worsen

## 2020-09-19 ENCOUNTER — Other Ambulatory Visit: Payer: Self-pay | Admitting: Internal Medicine

## 2020-09-19 NOTE — Telephone Encounter (Signed)
Alternative Requested:THE PRESCRIBED MEDICATION IS NOT COVERED BY INSURANCE. PLEASE CONSIDER CHANGING TO ONE OF THE SUGGESTED COVERED ALTERNATIVES.  

## 2020-09-19 NOTE — Telephone Encounter (Signed)
He was buying OTC. He can just continue to do that.

## 2020-11-20 ENCOUNTER — Other Ambulatory Visit: Payer: Self-pay | Admitting: Family Medicine

## 2020-12-05 ENCOUNTER — Telehealth: Payer: Self-pay

## 2020-12-05 NOTE — Telephone Encounter (Signed)
Spoke with patient to triage and he stated that over the past two weeks he feels that his asthma has "flared up" and he has noticed more wheezing and SOB. Patient denied SOB at this time and was not in distress. Patient denied chest pain, fever, chills, nasal congestion, cough, N/V/D, loss of taste or smell, or other acute symptoms. Patient denied exposure or positive COVID test in the past two weeks. Advised patient that if his symptoms became worse or if he developed any new ones to go to ED. Patient verbalized understanding. Please advise if ok to see in office?

## 2020-12-05 NOTE — Telephone Encounter (Signed)
Pt called in reporting SOB and wheezing x 2 weeks (has asthma)... Denies URI Sx, exposure to covid... denies chest pain or cardiac Sx... pt reports he has not had Albuterol inhaler in a while and has only been taking Advair 2, sometimes 3 times daily... pt advised he should not use more than 2 times daily....  Pt scheduled with you at 12pm tomorrow... please advise if appropriate.[facility]

## 2020-12-06 ENCOUNTER — Ambulatory Visit (INDEPENDENT_AMBULATORY_CARE_PROVIDER_SITE_OTHER): Payer: BC Managed Care – PPO | Admitting: Family Medicine

## 2020-12-06 ENCOUNTER — Encounter: Payer: Self-pay | Admitting: Family Medicine

## 2020-12-06 ENCOUNTER — Other Ambulatory Visit: Payer: Self-pay

## 2020-12-06 VITALS — BP 140/70 | HR 64 | Temp 98.0°F | Ht 70.0 in | Wt 219.5 lb

## 2020-12-06 DIAGNOSIS — J301 Allergic rhinitis due to pollen: Secondary | ICD-10-CM | POA: Diagnosis not present

## 2020-12-06 DIAGNOSIS — J4541 Moderate persistent asthma with (acute) exacerbation: Secondary | ICD-10-CM

## 2020-12-06 MED ORDER — ALBUTEROL SULFATE HFA 108 (90 BASE) MCG/ACT IN AERS: INHALATION_SPRAY | RESPIRATORY_TRACT | 3 refills | Status: DC

## 2020-12-06 MED ORDER — MONTELUKAST SODIUM 10 MG PO TABS: 10.0000 mg | ORAL_TABLET | Freq: Every day | ORAL | 3 refills | Status: DC

## 2020-12-06 NOTE — Assessment & Plan Note (Signed)
No clear indication for steroids at this point. Reviewed use of Advair and use albuterol prn. Treat allergies... most likely trigger. No evidence of bacterial or viral infection.

## 2020-12-06 NOTE — Assessment & Plan Note (Signed)
Restart flonase, continue generic claritin and add sinulair

## 2020-12-06 NOTE — Progress Notes (Signed)
Subjective   Patient ID: Randy King, male    DOB: [birth year], 55 y.o.   MRN: [id number]  This visit was conducted in person.  BP 140/70   Pulse 64   Temp 98 F (36.7 C) (Temporal)   Ht 5\' 10"  (1.778 m)   Wt 219 lb 8 oz (99.6 kg)   SpO2 96%   BMI 31.49 kg/m    CC:      Chief Complaint  Patient presents with  . Shortness of Breath  . Wheezing  . Asthma    Pollen has effective his breathing    Subjective:   HPI: Randy King is a 55 y.o. male pt of Dr. 59 with history of   Allergies and asthmapresenting on 12/06/2020 for Shortness of Breath, Wheezing, and Asthma (Pollen has effective his breathing)  He reports in last 2 weeks  With pollen he has sneeze, mild congestion.. feels DOE, occ wheeze,  Some chest tightness.   no fever, no chills.   On Advair   .12/08/2020 was taking incorrectly 3 times daily  Albuterol inh .. does not have any.  Not using flonase but using off brand Claritin.   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 worsened breathing.     Relevant past medical, surgical, family and social history reviewed and updated as indicated. Interim medical history since our last visit reviewed. Allergies and medications reviewed and updated.       Outpatient Medications Prior to Visit  Medication Sig Dispense Refill  . albuterol (VENTOLIN HFA) 108 (90 Base) MCG/ACT inhaler INHALE 2 PUFFS INTO THE LUNGS EVERY 6 (SIX) HOURS AS NEEDED FOR WHEEZING OR SHORTNESS OF BREATH. 18 g 3  . famotidine (PEPCID) 20 MG tablet Take 1 tablet (20 mg total) by mouth at bedtime. 90 tablet 1  . fluticasone (FLONASE) 50 MCG/ACT nasal spray Place 2 sprays into both nostrils daily.    . Fluticasone-Salmeterol (ADVAIR DISKUS) 250-50 MCG/DOSE AEPB INHALE 1 PUFF BY MOUTH TWICE A DAY 180 each 3  . ibuprofen (ADVIL,MOTRIN) 600 MG tablet Take 1 tablet (600 mg total) by mouth every 8 (eight) hours as needed. 30 tablet 0  . Multiple Vitamin (MULTIVITAMIN)  tablet Take 1 tablet by mouth daily.    . sildenafil (REVATIO) 20 MG tablet Take 1-5 tablets (20-100 mg total) by mouth daily as needed. 50 tablet 12  . tiZANidine (ZANAFLEX) 4 MG tablet Take 1 tablet (4 mg total) by mouth every 6 (six) hours as needed for muscle spasms (sedation caution). 30 tablet 1  . traMADol (ULTRAM) 50 MG tablet TAKE 2 TABLETS BY MOUTH 3 TIMES A DAY. 180 tablet 5   No facility-administered medications prior to visit.     Per HPI unless specifically indicated in ROS section below Review of Systems Objective:  BP 140/70   Pulse 64   Temp 98 F (36.7 C) (Temporal)   Ht 5\' 10"  (1.778 m)   Wt 219 lb 8 oz (99.6 kg)   SpO2 96%   BMI 31.49 kg/m      Wt Readings from Last 3 Encounters:  12/06/20 219 lb 8 oz (99.6 kg)  09/17/20 224 lb (101.6 kg)  05/08/20 223 lb 2 oz (101.2 kg)     Objective    Physical Exam Physical Exam HENT:     Head: Normocephalic.     Mouth/Throat:     Mouth: Mucous membranes are moist.  Eyes:     Extraocular Movements: Extraocular movements  intact.     Pupils: Pupils are equal, round, and reactive to light.  Cardiovascular:     Rate and Rhythm: Normal rate and regular rhythm.     Heart sounds: No murmur heard.   Pulmonary:     Effort: Pulmonary effort is normal.     Breath sounds: Decreased breath sounds present. No wheezing, rhonchi or rales.  Chest:     Chest wall: No tenderness.  Musculoskeletal:     Cervical back: Normal range of motion.  Neurological:     Mental Status: He is alert.           Results for orders placed or performed in visit on 12/05/19  Cologuard  Result Value Ref Range   Cologuard Negative Negative    This visit occurred during the SARS-CoV-2 public health emergency. Safety protocols were in place, including screening questions prior to the visit, additional usage of staff PPE, and extensive cleaning of exam room while observing appropriate contact time as indicated for disinfecting  solutions.   COVID 19 screen:  No recent travel or known exposure to COVID19 The patient denies respiratory symptoms of COVID 19 at this time. The importance of social distancing was discussed today.   Assessment and Plan   Problem List Items Addressed This Visit    Allergic rhinitis due to pollen    Restart flonase, continue generic claritin and add sinulair      Moderate persistent asthma with exacerbation - Primary    No clear indication for steroids at this point. Reviewed use of Advair and use albuterol prn. Treat allergies... most likely trigger. No evidence of bacterial or viral infection.       Relevant Medications   montelukast (SINGULAIR) 10 MG tablet   albuterol (VENTOLIN HFA) 108 (90 Base) MCG/ACT inhaler      Kerby Nora, MD

## 2020-12-06 NOTE — Patient Instructions (Addendum)
Continue Advair twice daily .  Continue generic antihistamine... claritin or zyrtec. Restart flonase 2 spray per nostril.  Use albuterol inhaler as needed every 4-6 hours for wheeze and shortness of breaht rescue.  Start singulair at bedtime for allergy and asthma control..SUBJECTIVE: tart each year 1 month before allergy season then stop after allergy season over.  Call if not improving as expected.

## 2020-12-06 NOTE — Telephone Encounter (Signed)
Seen in office.

## 2020-12-21 ENCOUNTER — Other Ambulatory Visit: Payer: Self-pay | Admitting: Family Medicine

## 2021-01-20 ENCOUNTER — Other Ambulatory Visit: Payer: Self-pay | Admitting: Family Medicine

## 2021-01-22 NOTE — Telephone Encounter (Signed)
Pt called asking when his medications would be called in. I advised him I just wrote them up to go to Dr Para March earlier this afternoon. Hopefully they will get done today but I cannot guarantee that.

## 2021-01-22 NOTE — Telephone Encounter (Signed)
Sildenafil last filled 02-14-20 #50 Tramadol last filled 12-21-20 Last OV Acute 12-06-20 Last OV/CPE 10-10-19 No Future OV [facility]

## 2021-01-23 NOTE — Telephone Encounter (Signed)
Pt called to ck on status of tramadol since pt is out of med. Pt advised if could give 48 - 72 hours for refill request and pt voiced understanding. Pt hopes can get refill for tramadol done today. Sending note to Dr Para March who is out of office but I think will be looking at desktop today. Pt said would get notice from pharmacy when ready for pick up.

## 2021-01-23 NOTE — Telephone Encounter (Signed)
[facility] Night - Client Nonclinical Telephone Record AccessNurse Client [facility] Night - Client Client Site [facility] - Night Physician Raechel Ache - MD Contact Type Call Who Is Calling Patient / Member / Family / Caregiver Caller Name Yvon Mccord Caller Phone Number [phone] Patient Name Randy King Patient DOB [birth year] Call Type Message Only Information Provided Reason for Call Request for General Office Information Initial Comment Caller was waiting for Dr. Para March to send med for pick up. Caller is requesting status. He spoke with office at 2 p.m.and was just sent. He is experiencing back pain. He requested early yesterday morning. Additional Comment Caller declined triage. Provided office hours. Disp. Time Disposition Final User 01/22/2021 6:37:36 PM General Information Provided Yes Lopez-Craine, Dahlia Call Closed By: Chalmers Guest Transaction Date/Time: 01/22/2021 6:32:13 PM (ET)

## 2021-01-23 NOTE — Telephone Encounter (Signed)
Sent. Thanks.   

## 2021-02-11 DIAGNOSIS — Z20822 Contact with and (suspected) exposure to covid-19: Secondary | ICD-10-CM | POA: Diagnosis not present

## 2021-04-15 ENCOUNTER — Other Ambulatory Visit: Payer: Self-pay

## 2021-04-15 ENCOUNTER — Ambulatory Visit (INDEPENDENT_AMBULATORY_CARE_PROVIDER_SITE_OTHER): Payer: BC Managed Care – PPO | Admitting: Family Medicine

## 2021-04-15 ENCOUNTER — Encounter: Payer: Self-pay | Admitting: Family Medicine

## 2021-04-15 VITALS — BP 140/86 | HR 76 | Temp 97.6°F | Ht 70.0 in | Wt 210.0 lb

## 2021-04-15 DIAGNOSIS — E785 Hyperlipidemia, unspecified: Secondary | ICD-10-CM

## 2021-04-15 DIAGNOSIS — K644 Residual hemorrhoidal skin tags: Secondary | ICD-10-CM | POA: Diagnosis not present

## 2021-04-15 DIAGNOSIS — Z125 Encounter for screening for malignant neoplasm of prostate: Secondary | ICD-10-CM

## 2021-04-15 MED ORDER — PROCTOFOAM HC 1-1 % EX FOAM: 1.0000 | Freq: Two times a day (BID) | CUTANEOUS | 1 refills | Status: DC

## 2021-04-15 NOTE — Patient Instructions (Addendum)
Go to the lab on the way out.   If you have mychart we'll likely use that to update you.    Take care.  Glad to see you. I would avoid straining for the next few days.

## 2021-04-15 NOTE — Progress Notes (Signed)
This visit occurred during the SARS-CoV-2 public health emergency.  Safety protocols were in place, including screening questions prior to the visit, additional usage of staff PPE, and extensive cleaning of exam room while observing appropriate contact time as indicated for disinfecting solutions.  D/w pt about getting routine labs done in the near future, he can do this on the way out of clinic.  See notes on labs.  History of hyperlipidemia.  PSA pending.   Hemorrhoids.  Got a new bike, had been riding a lot.  Sx going on for about 10 days, better today.  Used OTC cream.  He is still trying to be active.  Blood in stool once but not now.  No FCNAVD.  He had prev rectal pain.  He feels better now but he missed work on 04/13/21 due to pain.    Meds, vitals, and allergies reviewed.   ROS: Per HPI unless specifically indicated in ROS section   Nad ncat Small ext hemorrhoid.  No gross blood.  No fissure seen. Rrr ctab

## 2021-04-16 LAB — COMPREHENSIVE METABOLIC PANEL
ALT: 23 U/L (ref 0–53)
AST: 21 U/L (ref 0–37)
Albumin: 4.2 g/dL (ref 3.5–5.2)
Alkaline Phosphatase: 28 U/L — ABNORMAL LOW (ref 39–117)
BUN: 24 mg/dL — ABNORMAL HIGH (ref 6–23)
CO2: 26 mEq/L (ref 19–32)
Calcium: 10 mg/dL (ref 8.4–10.5)
Chloride: 105 mEq/L (ref 96–112)
Creatinine, Ser: 1.44 mg/dL (ref 0.40–1.50)
GFR: 54.24 mL/min — ABNORMAL LOW (ref >60.00)
Glucose, Bld: 85 mg/dL (ref 70–99)
Potassium: 4.6 mEq/L (ref 3.5–5.1)
Sodium: 139 mEq/L (ref 135–145)
Total Bilirubin: 0.4 mg/dL (ref 0.2–1.2)
Total Protein: 6.6 g/dL (ref 6.0–8.3)

## 2021-04-16 LAB — LIPID PANEL
Cholesterol: 303 mg/dL — ABNORMAL HIGH (ref 0–200)
HDL: 61.6 mg/dL (ref >39.00)
LDL Cholesterol: 228 mg/dL — ABNORMAL HIGH (ref 0–99)
NonHDL: 241.11
Total CHOL/HDL Ratio: 5
Triglycerides: 64 mg/dL (ref 0.0–149.0)
VLDL: 12.8 mg/dL (ref 0.0–40.0)

## 2021-04-16 LAB — PSA: PSA: 0.9 ng/mL (ref 0.10–4.00)

## 2021-04-17 DIAGNOSIS — K644 Residual hemorrhoidal skin tags: Secondary | ICD-10-CM | POA: Insufficient documentation

## 2021-04-17 NOTE — Assessment & Plan Note (Signed)
Anatomy discussed with patient.  Improving and not needing thrombectomy at this point.  Can use Proctofoam now or in the future as needed.  Update me as needed.  He agrees with plan.  Would avoid straining for the next few days.  Work note given.

## 2021-05-06 ENCOUNTER — Ambulatory Visit (INDEPENDENT_AMBULATORY_CARE_PROVIDER_SITE_OTHER): Payer: BC Managed Care – PPO | Admitting: Family Medicine

## 2021-05-06 ENCOUNTER — Other Ambulatory Visit: Payer: Self-pay

## 2021-05-06 ENCOUNTER — Encounter: Payer: Self-pay | Admitting: Family Medicine

## 2021-05-06 VITALS — BP 130/88 | HR 83 | Temp 97.6°F | Ht 70.0 in | Wt 211.0 lb

## 2021-05-06 DIAGNOSIS — Z23 Encounter for immunization: Secondary | ICD-10-CM | POA: Diagnosis not present

## 2021-05-06 DIAGNOSIS — E785 Hyperlipidemia, unspecified: Secondary | ICD-10-CM

## 2021-05-06 NOTE — Patient Instructions (Signed)
It would be reasonable to either start a statin (assuming you didn't have aches) or get the cardiac CT done.   Either way, keep working on diet and exercise.  Let me know which way you want to go.  Take care.  Glad to see you.

## 2021-05-06 NOTE — Progress Notes (Signed)
This visit occurred during the SARS-CoV-2 public health emergency.  Safety protocols were in place, including screening questions prior to the visit, additional usage of staff PPE, and extensive cleaning of exam room while observing appropriate contact time as indicated for disinfecting solutions.  The 10-year ASCVD risk score (Arnett DK, et al., 2019) is: 7.6%   Values used to calculate the score:     Age: 55 years     Sex: Male     Is Non-Hispanic African American: Yes     Diabetic: No     Tobacco smoker: No     Systolic Blood Pressure: 130 mmHg     Is BP treated: No     HDL Cholesterol: 61.6 mg/dL     Total Cholesterol: 303 mg/dL  Hyperlipidemia discussed with patient.  ASCVD score and treatment/evaluation options discussed with patient.  It is noted that he has a high level of exercise/fitness at baseline. He had been eating more [place] for breakfast with more bacon over the last month.  Discussed changing his diet in the meantime.  He also had a recent tick bite and he wanted that evaluated.  No systemic symptoms.  He had some local skin changes that had persisted.  Meds, vitals, and allergies reviewed.   ROS: Per HPI unless specifically indicated in ROS section   Nad Ncat Neck supple, no LA Rrr Ctab Abd soft Skin well perfused. Tick bite site with post inflammatory changes on the R upper arm, i.e. a small amount of granulation/scar tissue but no spreading erythema or ulceration rash or bull's-eye change.  Not tender. (Reassured.  He can update me as needed.)

## 2021-05-08 NOTE — Assessment & Plan Note (Signed)
We talked about cholesterol metabolism in general and atherosclerotic changes.  We talked about statin indications, routine instructions, routine cautions, especially regarding myalgias.  We talked about potential cardiac CT to evaluate for atherosclerotic changes, since that would reinforce the need for more aggressive treatment.  He is going to work on diet.  He exercises a lot at baseline and has good fitness level.  He is not having chest pain.  My concern is about his level of hyperlipidemia, in spite of his exercise routine.  It would be reasonable to either start a statin (assuming he didn't have aches) or get the cardiac CT done.   Either way, keep working on diet and exercise.  He will consider the 2 options and he will let me know which way he wants to go.

## 2021-07-23 ENCOUNTER — Other Ambulatory Visit: Payer: Self-pay | Admitting: Family Medicine

## 2021-07-23 NOTE — Telephone Encounter (Signed)
Refill request for traMADol (ULTRAM) 50 MG tablet  LOV - 05/06/21 Next OV - not scheduled Last refill - 01/23/21 #180/5

## 2021-07-24 NOTE — Telephone Encounter (Signed)
Sent. Thanks.   

## 2021-09-16 ENCOUNTER — Other Ambulatory Visit: Payer: Self-pay

## 2021-09-16 ENCOUNTER — Encounter: Payer: Self-pay | Admitting: Family Medicine

## 2021-09-16 ENCOUNTER — Ambulatory Visit (INDEPENDENT_AMBULATORY_CARE_PROVIDER_SITE_OTHER): Payer: Self-pay | Admitting: Family Medicine

## 2021-09-16 VITALS — BP 130/80 | HR 77 | Temp 97.7°F | Ht 70.0 in | Wt 223.0 lb

## 2021-09-16 DIAGNOSIS — E785 Hyperlipidemia, unspecified: Secondary | ICD-10-CM

## 2021-09-16 DIAGNOSIS — R059 Cough, unspecified: Secondary | ICD-10-CM

## 2021-09-16 LAB — LIPID PANEL
Cholesterol: 263 mg/dL — ABNORMAL HIGH (ref 0–200)
HDL: 52.1 mg/dL (ref >39.00)
LDL Cholesterol: 190 mg/dL — ABNORMAL HIGH (ref 0–99)
NonHDL: 210.54
Total CHOL/HDL Ratio: 5
Triglycerides: 103 mg/dL (ref 0.0–149.0)
VLDL: 20.6 mg/dL (ref 0.0–40.0)

## 2021-09-16 NOTE — Progress Notes (Signed)
This visit occurred during the SARS-CoV-2 public health emergency.  Safety protocols were in place, including screening questions prior to the visit, additional usage of staff PPE, and extensive cleaning of exam room while observing appropriate contact time as indicated for disinfecting solutions.  Cough.  Multiple sick contacts.  He has been sick for about 2 weeks.  Mult family members tested neg for covid.  Family members were sick for about 2 weeks.    He took a few days off from work due to cough- he couldn't work with the cough.  Using SABA more frequently recently due to wheeze with the illness.  He noted some mild dyspnea since this illness started.  Cough is slowly getting better "loosing up."  Some scant sputum production, faint yellowish and that is thinning out some.  No FCNAVD.  No ST.    We talked about recheck lipids today.  See notes on labs.  Prev labs d/w pt.  He is fasting except for small protein bar.    H/o hemorrhoids.  No blood in stool but occ noted a sensation in the midst of deadlifting.  No pain.  D/w pt about limiting deadlift weight.    Meds, vitals, and allergies reviewed.   ROS: Per HPI unless specifically indicated in ROS section   GEN: nad, alert and oriented HEENT: ncat NECK: supple w/o LA CV: rrr PULM: ctab, no inc wob ABD: soft, +bs EXT: no edema SKIN: well perfused.

## 2021-09-16 NOTE — Patient Instructions (Addendum)
Keep using albuterol if needed for cough.  Keep using your advair at baseline.  Wear a mask if it is dusty at work.  The cough should get better.  Update me as needed.   Go to the lab on the way out.   If you have mychart we'll likely use that to update you.    Take care.  Glad to see you.

## 2021-09-18 NOTE — Assessment & Plan Note (Signed)
See notes on labs. 

## 2021-09-18 NOTE — Assessment & Plan Note (Signed)
Likely postviral cough that should gradually resolve.  He is improving some recently.  Still okay for outpatient follow-up.  Lungs are clear.  Keep using albuterol if needed for cough.  Keep using advair at baseline.  Wear a mask if it is dusty at work.  He can update me as needed.  Work note done.

## 2021-11-01 ENCOUNTER — Other Ambulatory Visit: Payer: Self-pay

## 2021-11-01 ENCOUNTER — Encounter: Payer: Self-pay | Admitting: Family Medicine

## 2021-11-01 ENCOUNTER — Ambulatory Visit (INDEPENDENT_AMBULATORY_CARE_PROVIDER_SITE_OTHER): Payer: 59 | Admitting: Family Medicine

## 2021-11-01 DIAGNOSIS — R195 Other fecal abnormalities: Secondary | ICD-10-CM | POA: Diagnosis not present

## 2021-11-01 NOTE — Progress Notes (Signed)
This visit occurred during the SARS-CoV-2 public health emergency.  Safety protocols were in place, including screening questions prior to the visit, additional usage of staff PPE, and extensive cleaning of exam room while observing appropriate contact time as indicated for disinfecting solutions. ? ?Sx started in the last two week.  Started with constipation, then had mushy stools.  Then alternated back to constipation.  Then closer to normal now.  He was prev drinking water flavored with lemon/hot pepper/cinnamon, unclear if that was contributory.  No diarrhea now.  No fevers, no chills.  No vomiting.  No abd pain.  Prev with some abd bloating.  He is getting better in the last few days.  No blood in stool.  No black stools.  No tarry stools. No one else is sick at home but there has been a GI illness present in the community.  No missed work.  No new meds.   ? ?Meds, vitals, and allergies reviewed.  ? ?ROS: Per HPI unless specifically indicated in ROS section  ? ?GEN: nad, alert and oriented ?HEENT: ncat ?NECK: supple w/o LA ?CV: rrr. ?PULM: ctab, no inc wob ?ABD: soft, +bs, nontender to palpation. ?EXT: no edema ?SKIN: no acute rash ? ?

## 2021-11-01 NOTE — Patient Instructions (Signed)
I presume you had a self limited GI illness.  You should do fine.  If you have more symptoms then please let me know.  ?Reasonable to use a probiotic or eat activia or similar if needed/as tolerated.   ?Take care.  Glad to see you. ?

## 2021-11-03 DIAGNOSIS — R195 Other fecal abnormalities: Secondary | ICD-10-CM | POA: Insufficient documentation

## 2021-11-03 NOTE — Assessment & Plan Note (Signed)
Symptoms improved in the meantime.  Reassuring history and exam today ?I presume he had a self limited GI illness.  He should do fine.  If more symptoms then he can let me know.  ?Reasonable to use a probiotic or eat activia or similar if needed/as tolerated.   ?

## 2021-11-29 ENCOUNTER — Other Ambulatory Visit: Payer: Self-pay | Admitting: Family Medicine

## 2021-12-20 ENCOUNTER — Ambulatory Visit (INDEPENDENT_AMBULATORY_CARE_PROVIDER_SITE_OTHER): Payer: 59 | Admitting: Family

## 2021-12-20 ENCOUNTER — Encounter: Payer: Self-pay | Admitting: Family

## 2021-12-20 VITALS — BP 138/82 | HR 75 | Temp 97.7°F | Resp 16 | Ht 70.0 in | Wt 217.1 lb

## 2021-12-20 DIAGNOSIS — J309 Allergic rhinitis, unspecified: Secondary | ICD-10-CM | POA: Diagnosis not present

## 2021-12-20 DIAGNOSIS — J4541 Moderate persistent asthma with (acute) exacerbation: Secondary | ICD-10-CM

## 2021-12-20 DIAGNOSIS — R030 Elevated blood-pressure reading, without diagnosis of hypertension: Secondary | ICD-10-CM | POA: Diagnosis not present

## 2021-12-20 MED ORDER — PREDNISONE 20 MG PO TABS: ORAL_TABLET | ORAL | 0 refills | Status: DC

## 2021-12-20 MED ORDER — ALBUTEROL SULFATE HFA 108 (90 BASE) MCG/ACT IN AERS: INHALATION_SPRAY | RESPIRATORY_TRACT | 3 refills | Status: DC

## 2021-12-20 NOTE — Patient Instructions (Addendum)
restart taking Singulair 10 mg nightly.  ?Start nightly zyrtec (antihistamine) ?Start daily flonase  (fluticasone) nose spray.  ? ?Start monitoring your blood pressure daily, around the same time of day, for the next 2-3 weeks.  Ensure that you have rested for 30 minutes prior to checking your blood pressure. Record your readings and bring them to your next visit.goal is less than 130/90 ? ?Due to recent changes in healthcare laws, you may see results of your imaging and/or laboratory studies on MyChart before I have had a chance to review them.  I understand that in some cases there may be results that are confusing or concerning to you. Please understand that not all results are received at the same time and often I may need to interpret multiple results in order to provide you with the best plan of care or course of treatment. Therefore, I ask that you please give me 2 business days to thoroughly review all your results before contacting my office for clarification. Should we see a critical lab result, you will be contacted sooner.  ? ?It was a pleasure seeing you today! Please do not hesitate to reach out with any questions and or concerns. ? ?Regards,  ? ?Pearson Reasons ?FNP-C ? ? ?

## 2021-12-20 NOTE — Assessment & Plan Note (Addendum)
rx prednisone 20 mg ?Restart singulair ?Recommend zyrtec nightly ?Daily flonase ?Follow up with PCP in one month to monitor control of asthma ? ?Refill ventolin HFA 108 mcg  ?Stressed to pt do not take more than prescribed for twice daily advair ?

## 2021-12-20 NOTE — Assessment & Plan Note (Signed)
Pt advised of the following:  °Continue medication as prescribed. Monitor blood pressure periodically and/or when you feel symptomatic. Goal is <130/90 on average. Ensure that you have rested for 30 minutes prior to checking your blood pressure. Record your readings and bring them to your next visit if necessary.work on a low sodium diet. ° °

## 2021-12-20 NOTE — Assessment & Plan Note (Signed)
Restart singulair 10 mg ?Start flonase 50 mcg  ?Start nightly zyrtec 10 mg ?

## 2021-12-20 NOTE — Progress Notes (Signed)
? ?Established Patient Office Visit ? ?Subjective:  ?Patient ID: Randy King, male    DOB: 1966-03-01  Age: 56 y.o. MRN: [id number] ? ?CC:  ?Chief Complaint  ?Patient presents with  ?? Asthma  ?  X 1 week  ? ? ?HPI ?Randy King is here today with concerns.  ? ?For one week with nasal congestion, slight dry non productive cough, no chest congestoin.  ?No ear pain. No sore throat.  ?Is not sneezing.  ?No sinus pain.  ? ?Pt ran out of the red albuterol inhaler.  ?Taking advair one puff twice daily. Has went over twice a day  ?Not currently taking flonase.  ?Not taking an antihistamine.   ? ?Over the last two weeks, waking up at least once nightly with sob.  ? ? ?Past Medical History:  ?Diagnosis Date  ?? Back pain   ? frequent  ?? Childhood asthma   ? no symptoms as adult.  multiple hospital visits, intubated once for RAD as a child  ?? Elevated serum creatinine   ? history of, likely related to muscle mass and not true renal disease  ?? Hyperlipidemia   ?? Knee pain   ? frequent  ? ? ?Past Surgical History:  ?Procedure Laterality Date  ?? LIPOMA EXCISION  12/29/08  ? right posterior neck, Dr. Daphine Deutscher  ? ? ?Family History  ?Problem Relation Age of Onset  ?? Diabetes Mother   ?? Cancer Father   ?     prostate, dx'd in his 64's  ?? Prostate cancer Father   ?? Alcohol abuse Maternal Uncle   ?? Stroke Neg Hx   ?? Depression Neg Hx   ?? Drug abuse Neg Hx   ?? Colon cancer Neg Hx   ? ? ?Social History  ? ?Socioeconomic History  ?? Marital status: Married  ?  Spouse name: Not on file  ?? Number of children: 2  ?? Years of education: Not on file  ?? Highest education level: Not on file  ?Occupational History  ?? Occupation: [employer], forklift driver  ?Tobacco Use  ?? Smoking status: Never  ?? Smokeless tobacco: Never  ?Substance and Sexual Activity  ?? Alcohol use: Yes  ?  Alcohol/week: 0.0 standard drinks  ?  Comment: rare  ?? Drug use: No  ?? Sexual activity: Not on file  ?Other Topics Concern  ?? Not on  file  ?Social History Narrative  ? Lives with wife, has 2 daughters  ? From [place], in [place] since 2004  ? Exercise- cardio and weights.  ? Enjoys watching bull riding.  ? ?Social Determinants of Health  ? ?Financial Resource Strain: Not on file  ?Food Insecurity: Not on file  ?Transportation Needs: Not on file  ?Physical Activity: Not on file  ?Stress: Not on file  ?Social Connections: Not on file  ?Intimate Partner Violence: Not on file  ? ? ?Outpatient Medications Prior to Visit  ?Medication Sig Dispense Refill  ?? ADVAIR DISKUS 250-50 MCG/ACT AEPB INHALE 1 PUFF BU MOUTH TWICE A DAY 180 each 3  ?? famotidine (PEPCID) 20 MG tablet Take 1 tablet (20 mg total) by mouth at bedtime. 90 tablet 1  ?? fluticasone (FLONASE) 50 MCG/ACT nasal spray Place 2 sprays into both nostrils daily.    ?? ibuprofen (ADVIL,MOTRIN) 600 MG tablet Take 1 tablet (600 mg total) by mouth every 8 (eight) hours as needed. 30 tablet 0  ?? montelukast (SINGULAIR) 10 MG tablet Take 1 tablet (10 mg total) by mouth  at bedtime. 30 tablet 3  ?? Multiple Vitamin (MULTIVITAMIN) tablet Take 1 tablet by mouth daily.    ?? traMADol (ULTRAM) 50 MG tablet TAKE 2 TABLETS BY MOUTH 3 TIMES A DAY 180 tablet 5  ?? albuterol (VENTOLIN HFA) 108 (90 Base) MCG/ACT inhaler INHALE 2 PUFFS INTO THE LUNGS EVERY 6 (SIX) HOURS AS NEEDED FOR WHEEZING OR SHORTNESS OF BREATH. 18 g 3  ?? hydrocortisone-pramoxine (PROCTOFOAM HC) rectal foam Place 1 applicator rectally 2 (two) times daily. (Patient not taking: Reported on 12/20/2021) 10 g 1  ?? sildenafil (REVATIO) 20 MG tablet TAKE 1-5 TABLETS (20-100 MG TOTAL) BY MOUTH DAILY AS NEEDED. (Patient not taking: Reported on 12/20/2021) 50 tablet 12  ?? tiZANidine (ZANAFLEX) 4 MG tablet Take 1 tablet (4 mg total) by mouth every 6 (six) hours as needed for muscle spasms (sedation caution). (Patient not taking: Reported on 12/20/2021) 30 tablet 1  ? ?No facility-administered medications prior to visit.  ? ? ?Allergies  ?Allergen Reactions  ??  Meloxicam Other (See Comments)  ?  GI upset  ? ? ?ROS ?Review of Systems  ?Constitutional:  Negative for chills, fatigue and fever.  ?HENT:  Positive for congestion. Negative for ear pain, postnasal drip, rhinorrhea, sinus pressure, sinus pain, sneezing and sore throat.   ?Eyes:  Negative for discharge.  ?Respiratory:  Positive for shortness of breath and wheezing (at times). Negative for cough and chest tightness.   ?Cardiovascular:  Negative for chest pain and palpitations.  ? ?  ?Objective:  ?  ?Physical Exam ?Constitutional:   ?   General: He is awake. He is not in acute distress. ?   Appearance: Normal appearance. He is not ill-appearing.  ?HENT:  ?   Right Ear: A middle ear effusion (clear) is present.  ?   Left Ear: Tympanic membrane normal.  ?   Nose: Nose normal.  ?   Right Turbinates: Enlarged. Not swollen.  ?   Left Turbinates: Enlarged. Not swollen.  ?   Right Sinus: No maxillary sinus tenderness or frontal sinus tenderness.  ?   Left Sinus: No maxillary sinus tenderness or frontal sinus tenderness.  ?   Mouth/Throat:  ?   Mouth: Mucous membranes are moist.  ?   Pharynx: No pharyngeal swelling, oropharyngeal exudate or posterior oropharyngeal erythema.  ?Eyes:  ?   Extraocular Movements: Extraocular movements intact.  ?   Pupils: Pupils are equal, round, and reactive to light.  ?Cardiovascular:  ?   Rate and Rhythm: Normal rate and regular rhythm.  ?Pulmonary:  ?   Effort: Pulmonary effort is normal.  ?   Breath sounds: Normal breath sounds. No wheezing.  ?Neurological:  ?   Mental Status: He is alert.  ? ? ?BP 138/82   Pulse 75   Temp 97.7 ?F (36.5 ?C)   Resp 16   Ht 5\' 10"  (1.778 m)   Wt 217 lb 1 oz (98.5 kg)   SpO2 97%   BMI 31.15 kg/m?  ?Wt Readings from Last 3 Encounters:  ?12/20/21 217 lb 1 oz (98.5 kg)  ?11/01/21 222 lb (100.7 kg)  ?09/16/21 223 lb (101.2 kg)  ? ? ? ?Health Maintenance Due  ?Topic Date Due  ?? Zoster Vaccines- Shingrix (1 of 2) Never done  ?? COVID-19 Vaccine (4 - Booster  for Pfizer series) 10/09/2020  ? ? ?There are no preventive care reminders to display for this patient. ? ?Lab Results  ?Component Value Date  ? TSH 2.02 06/28/2009  ? ?Lab  Results  ?Component Value Date  ? HGB 15.5 12/29/2008  ? ?Lab Results  ?Component Value Date  ? NA 139 04/15/2021  ? K 4.6 04/15/2021  ? CO2 26 04/15/2021  ? GLUCOSE 85 04/15/2021  ? BUN 24 (H) 04/15/2021  ? CREATININE 1.44 04/15/2021  ? BILITOT 0.4 04/15/2021  ? ALKPHOS 28 (L) 04/15/2021  ? AST 21 04/15/2021  ? ALT 23 04/15/2021  ? PROT 6.6 04/15/2021  ? ALBUMIN 4.2 04/15/2021  ? CALCIUM 10.0 04/15/2021  ? GFR 54.24 (L) 04/15/2021  ? ?No results found for: HGBA1C ? ?  ?Assessment & Plan:  ? ?Problem List Items Addressed This Visit   ? ?  ? Respiratory  ? Allergic rhinitis  ?  Restart singulair 10 mg ?Start flonase 50 mcg  ?Start nightly zyrtec 10 mg ? ?  ?  ? Moderate persistent asthma with exacerbation  ?  rx prednisone 20 mg ?Restart singulair ?Recommend zyrtec nightly ?Daily flonase ?Follow up with PCP in one month to monitor control of asthma ? ?Refill ventolin HFA 108 mcg  ?Stressed to pt do not take more than prescribed for twice daily advair ? ?  ?  ? Relevant Medications  ? predniSONE (DELTASONE) 20 MG tablet  ? albuterol (VENTOLIN HFA) 108 (90 Base) MCG/ACT inhaler  ?  ? Other  ? Elevated blood pressure reading - Primary  ?  Pt advised of the following:  ?Continue medication as prescribed. Monitor blood pressure periodically and/or when you feel symptomatic. Goal is <130/90 on average. Ensure that you have rested for 30 minutes prior to checking your blood pressure. Record your readings and bring them to your next visit if necessary.work on a low sodium diet. ? ? ?  ?  ? ? ?Meds ordered this encounter  ?Medications  ?? predniSONE (DELTASONE) 20 MG tablet  ?  Sig: Take two tablets daily for 3 days followed by one tablet daily for 4 days  ?  Dispense:  10 tablet  ?  Refill:  0  ?  Order Specific Question:   Supervising Provider  ?   Answer:   BEDSOLE, AMY E [[id number]]  ?? albuterol (VENTOLIN HFA) 108 (90 Base) MCG/ACT inhaler  ?  Sig: INHALE 2 PUFFS INTO THE LUNGS EVERY 6 (SIX) HOURS AS NEEDED FOR WHEEZING OR SHORTNESS OF BREATH.  ?  Dispense:

## 2022-02-06 ENCOUNTER — Other Ambulatory Visit: Payer: Self-pay | Admitting: Family Medicine

## 2022-02-06 NOTE — Telephone Encounter (Signed)
Last office vsiit 12/20/21 with Dugal for elevated blood pressure/asthma/allergic rhinitis.  Last refilled 07/24/2021 for #180 with 5 refills.  No future appointments.

## 2022-02-28 ENCOUNTER — Ambulatory Visit (INDEPENDENT_AMBULATORY_CARE_PROVIDER_SITE_OTHER): Payer: 59 | Admitting: Family Medicine

## 2022-02-28 ENCOUNTER — Encounter: Payer: Self-pay | Admitting: Family Medicine

## 2022-02-28 ENCOUNTER — Telehealth: Payer: Self-pay

## 2022-02-28 DIAGNOSIS — J4530 Mild persistent asthma, uncomplicated: Secondary | ICD-10-CM | POA: Diagnosis not present

## 2022-02-28 MED ORDER — PREDNISONE 20 MG PO TABS: 20.0000 mg | ORAL_TABLET | Freq: Every day | ORAL | 0 refills | Status: DC

## 2022-02-28 NOTE — Patient Instructions (Addendum)
At least for the summer-   Singulair at night.  Advair purple disk twice a day rinse after use.  Albuterol red pump if needed.    If still needing red pump more than 2 times a week, then let me know.    If worse, then start prednisone and let me know.   Take care.  Glad to see you.

## 2022-02-28 NOTE — Telephone Encounter (Signed)
[facility] Night - Client Nonclinical Telephone Record  [facility] Client [facility] Night - Client Client Site [facility] [place] - Night Provider Raechel Ache - MD Contact Type Call Who Is Calling Patient / Member / Family / Caregiver Caller Name Orval Dortch Caller Phone Number [phone] Patient Name Randy King Patient DOB 04/07/1966 Call Type Message Only Information Provided Reason for Call Request to Schedule Office Appointment Initial Comment Caller requesting appt. Patient request to speak to RN No Additional Comment Provided office hours. Declined triage. Disp. Time Disposition Final User 02/28/2022 7:48:13 AM General Information Provided Yes Junius Creamer Call Closed By: Junius Creamer Transaction Date/Time: 02/28/2022 7:46:02 AM (ET   Appt already scheduled 02/28/22 at 10 AM with Dr Para March.

## 2022-02-28 NOTE — Progress Notes (Unsigned)
Asthma follow up.  Had been off advair until recently.  Using SABA prn, rarely used normally but needed it more recently.  He had to leave work today.  SABA helps.  SOB and wheeze, improves with SABA use.  Not much cough.  No FCNAVD.  Still on singulair.  He never felt like he needed to go to ER.  Most recently used SABA this AM.   Meds, vitals, and allergies reviewed.   ROS: Per HPI unless specifically indicated in ROS section   GEN: nad, alert and oriented HEENT: ncat NECK: supple w/o LA CV: rrr. PULM: ctab, no inc wob, no wheeze. ABD: soft, +bs EXT: no edema SKIN: Well-perfused.

## 2022-03-02 ENCOUNTER — Encounter: Payer: Self-pay | Admitting: Family Medicine

## 2022-03-02 NOTE — Assessment & Plan Note (Signed)
He clearly had worse symptoms when he was younger.  He may only need Advair for the summer.  He may not need it later in the year.  Routine cautions given to patient.  At least for the summer-   Singulair at night.  Advair twice a day rinse after use.  Albuterol if needed.    If still needing albuterol more than 2 times a week, then let me know.    If worse, then start prednisone and let me know.  Routine steroid cautions given to patient.  Work note done.  Okay for outpatient follow-up.  He agrees to plan.

## 2022-08-08 ENCOUNTER — Encounter: Payer: Self-pay | Admitting: Nurse Practitioner

## 2022-08-08 ENCOUNTER — Ambulatory Visit: Payer: 59 | Admitting: Nurse Practitioner

## 2022-08-08 VITALS — BP 136/86 | HR 75 | Temp 98.5°F | Resp 16 | Ht 70.0 in | Wt 214.2 lb

## 2022-08-08 DIAGNOSIS — R6883 Chills (without fever): Secondary | ICD-10-CM | POA: Insufficient documentation

## 2022-08-08 DIAGNOSIS — R0982 Postnasal drip: Secondary | ICD-10-CM | POA: Diagnosis not present

## 2022-08-08 DIAGNOSIS — G4489 Other headache syndrome: Secondary | ICD-10-CM | POA: Insufficient documentation

## 2022-08-08 DIAGNOSIS — R52 Pain, unspecified: Secondary | ICD-10-CM | POA: Insufficient documentation

## 2022-08-08 DIAGNOSIS — R051 Acute cough: Secondary | ICD-10-CM

## 2022-08-08 LAB — POCT INFLUENZA A/B
Influenza A, POC: NEGATIVE
Influenza B, POC: NEGATIVE

## 2022-08-08 LAB — POC COVID19 BINAXNOW: SARS Coronavirus 2 Ag: NEGATIVE

## 2022-08-08 MED ORDER — BENZONATATE 200 MG PO CAPS: 200.0000 mg | ORAL_CAPSULE | Freq: Three times a day (TID) | ORAL | 0 refills | Status: DC | PRN

## 2022-08-08 NOTE — Assessment & Plan Note (Signed)
Flu and COVID test negative in office.  Patient can use over-the-counter analgesics as needed for relief.  Rest drink plenty of fluids

## 2022-08-08 NOTE — Assessment & Plan Note (Signed)
Flonase nasal spray.  Also right Tessalon Perles 200 mg 3 times daily as needed for cough.

## 2022-08-08 NOTE — Patient Instructions (Addendum)
Nice to see you today The flu and covid test are negative in office This should continue to improve  Follow up if you do not improve  Start using the flonase

## 2022-08-08 NOTE — Assessment & Plan Note (Signed)
Flu and COVID test negative in office.  Rest drink fluids over-the-counter analgesics as needed

## 2022-08-08 NOTE — Assessment & Plan Note (Signed)
Rest drink plenty of fluids over-the-counter analgesics as needed 

## 2022-08-08 NOTE — Assessment & Plan Note (Signed)
Patient has fluticasone nasal spray at home.  Encourage patient to start using this to help with postnasal drip

## 2022-08-08 NOTE — Progress Notes (Signed)
Acute Office Visit  Subjective:     Patient ID: Randy King, male    DOB: 1966/04/21, 56 y.o.   MRN: [id number]  Chief Complaint  Patient presents with   Cough    X 5 days    Headache     Patient is in today for sick symptoms  Patient has a history of asthma, gerd, and elevated blood pressure Flu has been going around the place of employment   Symptoms started approx 4 days ago Started on Monday 08/04/2022. Cough, chills, body ache and headache.  Covid vaccine: [employer] x2 and one booster Flu vaccine not up to day  Has been thera flu with some improvement twice a day  States that he has not had to use the albuterol inhaler States that he has had some improvement.   Review of Systems  Constitutional:  Positive for chills and malaise/fatigue. Negative for fever.       Appetite that has been decreased Fluid intake is good   HENT:  Positive for ear pain (achy that has resolved) and sinus pain. Negative for sore throat.   Respiratory:  Positive for cough, sputum production and shortness of breath.   Musculoskeletal:  Positive for myalgias.  Neurological:  Positive for headaches.        Objective:    BP 136/86   Pulse 75   Temp 98.5 F (36.9 C)   Resp 16   Ht 5\' 10"  (1.778 m)   Wt 214 lb 4 oz (97.2 kg)   SpO2 100%   BMI 30.74 kg/m    Physical Exam Vitals and nursing note reviewed.  Constitutional:      Appearance: Normal appearance.  HENT:     Right Ear: Tympanic membrane, ear canal and external ear normal.     Left Ear: Tympanic membrane, ear canal and external ear normal.     Nose:     Right Sinus: No maxillary sinus tenderness or frontal sinus tenderness.     Left Sinus: No maxillary sinus tenderness or frontal sinus tenderness.     Mouth/Throat:     Mouth: Mucous membranes are moist.     Pharynx: Oropharynx is clear.  Cardiovascular:     Rate and Rhythm: Normal rate and regular rhythm.     Heart sounds: Normal heart sounds.  Pulmonary:      Effort: Pulmonary effort is normal.     Breath sounds: Normal breath sounds.  Lymphadenopathy:     Cervical: No cervical adenopathy.  Neurological:     Mental Status: He is alert.     Results for orders placed or performed in visit on 08/08/22  POC COVID-19  Result Value Ref Range   SARS Coronavirus 2 Ag Negative Negative  Influenza A/B  Result Value Ref Range   Influenza A, POC Negative Negative   Influenza B, POC Negative Negative        Assessment & Plan:   Problem List Items Addressed This Visit       Other   Acute cough - Primary    Flonase nasal spray.  Also right Tessalon Perles 200 mg 3 times daily as needed for cough.      Relevant Medications   benzonatate (TESSALON) 200 MG capsule   Other Relevant Orders   POC COVID-19 (Completed)   Influenza A/B (Completed)   Body aches    Flu and COVID test negative in office.  Rest drink fluids over-the-counter analgesics as needed      Relevant Orders  POC COVID-19 (Completed)   Influenza A/B (Completed)   Chills    Rest drink plenty of fluids over-the-counter analgesics as needed      Relevant Orders   POC COVID-19 (Completed)   Influenza A/B (Completed)   Other headache syndrome    Flu and COVID test negative in office.  Patient can use over-the-counter analgesics as needed for relief.  Rest drink plenty of fluids      Relevant Orders   POC COVID-19 (Completed)   Influenza A/B (Completed)   PND (post-nasal drip)    Patient has fluticasone nasal spray at home.  Encourage patient to start using this to help with postnasal drip       Meds ordered this encounter  Medications   benzonatate (TESSALON) 200 MG capsule    Sig: Take 1 capsule (200 mg total) by mouth 3 (three) times daily as needed for cough.    Dispense:  21 capsule    Refill:  0    Order Specific Question:   Supervising Provider    Answer:   TOWER, MARNE A [[id number]    Return if symptoms worsen or fail to improve.  Audria Nine, NP

## 2022-08-13 ENCOUNTER — Other Ambulatory Visit: Payer: Self-pay | Admitting: Family Medicine

## 2022-08-13 NOTE — Telephone Encounter (Signed)
Refill request for TRAMADOL HCL 50 MG TABLET   LOV - 08/08/22 Next OV - not scheduled Last refill - 02/07/22 #180/5

## 2022-09-12 ENCOUNTER — Ambulatory Visit: Payer: 59 | Admitting: Family Medicine

## 2022-09-12 ENCOUNTER — Encounter: Payer: Self-pay | Admitting: Family Medicine

## 2022-09-12 VITALS — BP 110/84 | HR 66 | Temp 97.3°F | Ht 70.0 in | Wt 208.0 lb

## 2022-09-12 DIAGNOSIS — R112 Nausea with vomiting, unspecified: Secondary | ICD-10-CM | POA: Diagnosis not present

## 2022-09-12 MED ORDER — ONDANSETRON HCL 4 MG PO TABS: 4.0000 mg | ORAL_TABLET | Freq: Three times a day (TID) | ORAL | 1 refills | Status: DC | PRN

## 2022-09-12 NOTE — Progress Notes (Unsigned)
Nausea for 2 weeks, worse recently.  He asked if it could be related to tramadol.  Has been on med chronically.  Still on med at baseline, took it yesterday.    He was taking it in the early AM, then going to work.  2nd dose midday.  Noted nausea onset early afternoon.  Then it would get better.    Then this week is was all day. Vomited once.  No black or bloody vomitus.  No fevers.  Some constipation but better in the meantime.  Felt hot then cold yesterday and the day prior.  No cough, no ST.    No travel.  No new foods. Wife had upset stomach but that resolved. Dec in appetite.  No black stools.  He had some lower abd pain.  Tried dramamine w/o relief.  Normal urine.  No jaundice.    More back pain w/o taking tramadol in the meantime.    He had abd sx prior to cutting back on tramadol.    Meds, vitals, and allergies reviewed.   ROS: Per HPI unless specifically indicated in ROS section   GEN: nad, alert and oriented HEENT: mucous membranes moist NECK: supple w/o LA CV: rrr. PULM: ctab, no inc wob ABD: soft, +bs, not ttp EXT: no edema SKIN: no acute rash, no jaundice.

## 2022-09-12 NOTE — Patient Instructions (Addendum)
Go to the lab on the way out.   If you have mychart we'll likely use that to update you.    Take care.  Glad to see you. Try zofran as needed for nausea in the meantime.  I would take 1 tramadol twice a day in the meantime.

## 2022-09-13 LAB — COMPREHENSIVE METABOLIC PANEL
AG Ratio: 1.8 (calc) (ref 1.0–2.5)
ALT: 30 U/L (ref 9–46)
AST: 20 U/L (ref 10–35)
Albumin: 4 g/dL (ref 3.6–5.1)
Alkaline phosphatase (APISO): 29 U/L — ABNORMAL LOW (ref 35–144)
BUN: 23 mg/dL (ref 7–25)
CO2: 26 mmol/L (ref 20–32)
Calcium: 8.7 mg/dL (ref 8.6–10.3)
Chloride: 107 mmol/L (ref 98–110)
Creat: 1.14 mg/dL (ref 0.70–1.30)
Globulin: 2.2 g/dL (calc) (ref 1.9–3.7)
Glucose, Bld: 93 mg/dL (ref 65–99)
Potassium: 4.6 mmol/L (ref 3.5–5.3)
Sodium: 141 mmol/L (ref 135–146)
Total Bilirubin: 0.3 mg/dL (ref 0.2–1.2)
Total Protein: 6.2 g/dL (ref 6.1–8.1)

## 2022-09-13 LAB — CBC WITH DIFFERENTIAL/PLATELET
Absolute Monocytes: 1046 cells/uL — ABNORMAL HIGH (ref 200–950)
Basophils Absolute: 19 cells/uL (ref 0–200)
Basophils Relative: 0.2 %
Eosinophils Absolute: 230 cells/uL (ref 15–500)
Eosinophils Relative: 2.4 %
HCT: 46.2 % (ref 38.5–50.0)
Hemoglobin: 16.5 g/dL (ref 13.2–17.1)
Lymphs Abs: 2218 cells/uL (ref 850–3900)
MCH: 31.4 pg (ref 27.0–33.0)
MCHC: 35.7 g/dL (ref 32.0–36.0)
MCV: 88 fL (ref 80.0–100.0)
MPV: 11.2 fL (ref 7.5–12.5)
Monocytes Relative: 10.9 %
Neutro Abs: 6086 cells/uL (ref 1500–7800)
Neutrophils Relative %: 63.4 %
Platelets: 190 10*3/uL (ref 140–400)
RBC: 5.25 10*6/uL (ref 4.20–5.80)
RDW: 14.9 % (ref 11.0–15.0)
Total Lymphocyte: 23.1 %
WBC: 9.6 10*3/uL (ref 3.8–10.8)

## 2022-09-13 LAB — LIPASE: Lipase: 27 U/L (ref 7–60)

## 2022-09-14 DIAGNOSIS — R112 Nausea with vomiting, unspecified: Secondary | ICD-10-CM | POA: Insufficient documentation

## 2022-09-14 NOTE — Assessment & Plan Note (Signed)
Of unclear source. See notes on labs.  At this point still okay for outpatient follow-up.  Work note done. Reasonable to try zofran as needed for nausea in the meantime.  I would take 1 tramadol twice a day in the meantime.  I would not expect that to be the main issue here, since he has been on that medication without trouble in the long-term.

## 2022-09-16 ENCOUNTER — Ambulatory Visit: Payer: 59 | Admitting: Family Medicine

## 2022-12-07 ENCOUNTER — Other Ambulatory Visit: Payer: Self-pay | Admitting: Family Medicine

## 2023-02-17 ENCOUNTER — Other Ambulatory Visit: Payer: Self-pay | Admitting: Family Medicine

## 2023-02-17 NOTE — Telephone Encounter (Signed)
Refill request for TRAMADOL HCL 50 MG TABLET   LOV - 09/12/22 Next OV - not scheduled Last refill - 08/14/22 #180/5

## 2023-05-20 ENCOUNTER — Other Ambulatory Visit: Payer: Self-pay

## 2023-05-20 ENCOUNTER — Emergency Department (HOSPITAL_COMMUNITY)
Admission: EM | Admit: 2023-05-20 | Discharge: 2023-05-21 | Disposition: A | Discharge status: Home / Self Care | Payer: 59 | Source: Home / Self Care | Attending: Emergency Medicine | Admitting: Emergency Medicine

## 2023-05-20 ENCOUNTER — Emergency Department (HOSPITAL_COMMUNITY)
Admission: EM | Admit: 2023-05-20 | Discharge: 2023-05-20 | Disposition: A | Discharge status: Home / Self Care | Payer: 59 | Attending: Emergency Medicine | Admitting: Emergency Medicine

## 2023-05-20 ENCOUNTER — Encounter (HOSPITAL_COMMUNITY): Payer: Self-pay | Admitting: Emergency Medicine

## 2023-05-20 DIAGNOSIS — K0889 Other specified disorders of teeth and supporting structures: Secondary | ICD-10-CM | POA: Diagnosis not present

## 2023-05-20 DIAGNOSIS — R42 Dizziness and giddiness: Secondary | ICD-10-CM | POA: Insufficient documentation

## 2023-05-20 DIAGNOSIS — R112 Nausea with vomiting, unspecified: Secondary | ICD-10-CM | POA: Insufficient documentation

## 2023-05-20 DIAGNOSIS — I10 Essential (primary) hypertension: Secondary | ICD-10-CM | POA: Diagnosis not present

## 2023-05-20 DIAGNOSIS — Z7951 Long term (current) use of inhaled steroids: Secondary | ICD-10-CM | POA: Insufficient documentation

## 2023-05-20 DIAGNOSIS — D72829 Elevated white blood cell count, unspecified: Secondary | ICD-10-CM | POA: Diagnosis not present

## 2023-05-20 DIAGNOSIS — R1111 Vomiting without nausea: Secondary | ICD-10-CM | POA: Diagnosis not present

## 2023-05-20 DIAGNOSIS — R231 Pallor: Secondary | ICD-10-CM | POA: Diagnosis not present

## 2023-05-20 DIAGNOSIS — J45909 Unspecified asthma, uncomplicated: Secondary | ICD-10-CM | POA: Insufficient documentation

## 2023-05-20 DIAGNOSIS — R61 Generalized hyperhidrosis: Secondary | ICD-10-CM | POA: Diagnosis not present

## 2023-05-20 DIAGNOSIS — R55 Syncope and collapse: Secondary | ICD-10-CM | POA: Diagnosis not present

## 2023-05-20 DIAGNOSIS — R531 Weakness: Secondary | ICD-10-CM | POA: Insufficient documentation

## 2023-05-20 DIAGNOSIS — I499 Cardiac arrhythmia, unspecified: Secondary | ICD-10-CM | POA: Diagnosis not present

## 2023-05-20 LAB — URINALYSIS, ROUTINE W REFLEX MICROSCOPIC
Bacteria, UA: NONE SEEN
Bilirubin Urine: NEGATIVE
Glucose, UA: NEGATIVE mg/dL
Hgb urine dipstick: NEGATIVE
Ketones, ur: NEGATIVE mg/dL
Leukocytes,Ua: NEGATIVE
Nitrite: NEGATIVE
Protein, ur: NEGATIVE mg/dL
Specific Gravity, Urine: 1.018 (ref 1.005–1.030)
pH: 8 (ref 5.0–8.0)

## 2023-05-20 LAB — BASIC METABOLIC PANEL
Anion gap: 8 (ref 5–15)
Anion gap: 17 — ABNORMAL HIGH (ref 5–15)
BUN: 19 mg/dL (ref 6–20)
BUN: 18 mg/dL (ref 6–20)
CO2: 23 mmol/L (ref 22–32)
CO2: 20 mmol/L — ABNORMAL LOW (ref 22–32)
Calcium: 9.3 mg/dL (ref 8.9–10.3)
Calcium: 9.7 mg/dL (ref 8.9–10.3)
Chloride: 107 mmol/L (ref 98–111)
Chloride: 104 mmol/L (ref 98–111)
Creatinine, Ser: 1.26 mg/dL — ABNORMAL HIGH (ref 0.61–1.24)
Creatinine, Ser: 1.18 mg/dL (ref 0.61–1.24)
GFR, Estimated: >60 mL/min (ref >60)
GFR, Estimated: >60 mL/min (ref >60)
Glucose, Bld: 106 mg/dL — ABNORMAL HIGH (ref 70–99)
Glucose, Bld: 148 mg/dL — ABNORMAL HIGH (ref 70–99)
Potassium: 5 mmol/L (ref 3.5–5.1)
Potassium: 3.7 mmol/L (ref 3.5–5.1)
Sodium: 138 mmol/L (ref 135–145)
Sodium: 141 mmol/L (ref 135–145)

## 2023-05-20 LAB — CBC WITH DIFFERENTIAL/PLATELET
Abs Immature Granulocytes: 0.07 10*3/uL (ref 0.00–0.07)
Basophils Absolute: 0 10*3/uL (ref 0.0–0.1)
Basophils Relative: 0 %
Eosinophils Absolute: 0.1 10*3/uL (ref 0.0–0.5)
Eosinophils Relative: 1 %
HCT: 42.4 % (ref 39.0–52.0)
Hemoglobin: 15.3 g/dL (ref 13.0–17.0)
Immature Granulocytes: 0 %
Lymphocytes Relative: 12 %
Lymphs Abs: 1.9 10*3/uL (ref 0.7–4.0)
MCH: 30.7 pg (ref 26.0–34.0)
MCHC: 36.1 g/dL — ABNORMAL HIGH (ref 30.0–36.0)
MCV: 85.1 fL (ref 80.0–100.0)
Monocytes Absolute: 0.9 10*3/uL (ref 0.1–1.0)
Monocytes Relative: 6 %
Neutro Abs: 12.6 10*3/uL — ABNORMAL HIGH (ref 1.7–7.7)
Neutrophils Relative %: 81 %
Platelets: 209 10*3/uL (ref 150–400)
RBC: 4.98 MIL/uL (ref 4.22–5.81)
RDW: 13.5 % (ref 11.5–15.5)
WBC: 15.6 10*3/uL — ABNORMAL HIGH (ref 4.0–10.5)
nRBC: 0 % (ref 0.0–0.2)

## 2023-05-20 LAB — CBC
HCT: 43.6 % (ref 39.0–52.0)
Hemoglobin: 15.7 g/dL (ref 13.0–17.0)
MCH: 30.7 pg (ref 26.0–34.0)
MCHC: 36 g/dL (ref 30.0–36.0)
MCV: 85.2 fL (ref 80.0–100.0)
Platelets: 201 10*3/uL (ref 150–400)
RBC: 5.12 MIL/uL (ref 4.22–5.81)
RDW: 13.6 % (ref 11.5–15.5)
WBC: 12.5 10*3/uL — ABNORMAL HIGH (ref 4.0–10.5)
nRBC: 0 % (ref 0.0–0.2)

## 2023-05-20 MED ORDER — MECLIZINE HCL 25 MG PO TABS: 25.0000 mg | ORAL_TABLET | Freq: Three times a day (TID) | ORAL | 0 refills | Status: DC | PRN

## 2023-05-20 MED ORDER — SODIUM CHLORIDE 0.9 % IV BOLUS
1000.0000 mL | Freq: Once | INTRAVENOUS | Status: AC
  Administered 2023-05-20: 1000 mL via INTRAVENOUS

## 2023-05-20 MED ORDER — MECLIZINE HCL 25 MG PO TABS
25.0000 mg | ORAL_TABLET | Freq: Once | ORAL | Status: DC
  Filled 2023-05-20: qty 1

## 2023-05-20 MED ORDER — ONDANSETRON HCL 4 MG/2ML IJ SOLN
4.0000 mg | Freq: Once | INTRAMUSCULAR | Status: AC
  Administered 2023-05-20: 4 mg via INTRAVENOUS
  Filled 2023-05-20: qty 2

## 2023-05-20 NOTE — ED Triage Notes (Addendum)
EMS stated, on the way to work did not feel right. Had some N/V, dizziness and diaphoretic. Pt. Has had a toothache for about a week. He arrived at work and that's when they called. EMS.  Pt stated, yesterday I had an episode were my head was spinning. 18g rt. AC given 400cc and  CBG 165 , did not eat breakfast but unable due to Nausea.

## 2023-05-20 NOTE — ED Triage Notes (Signed)
Pt reports hx of vertigo and started this morning and was here and he started feeling better. It started again tonight and he is very nauseated. Pt is clammy and diaphoretic. CBG 127

## 2023-05-20 NOTE — ED Notes (Signed)
The pt was here earlier today he is having the same symptoms of dizziness and nausea he came in by ambulance  he reports that the eiddiness is not as bad but he still cannot control  his nausea and that he was not given any meds  sitting with both his eyes closed

## 2023-05-20 NOTE — ED Provider Notes (Signed)
[place] EMERGENCY DEPARTMENT AT [facility] Provider Note   CSN: [id number] Arrival date & time: 05/20/23  2054     History  Chief Complaint  Patient presents with   Dizziness    Randy King is a 57 y.o. male with a history of asthma, hyperlipidemia, and GERD who presents the ED today for vertigo.  Patient reports that he was seen this morning for the same concern.  His symptoms feel the same as before and have not worsened.  He reports that the vertigo went away but then came back several hours later.  He took Zofran, which did not help with the symptoms.  Patient reports that he feels like the room is spinning and is worse with movement.  Patient denies head injury, loss of consciousness, weakness, slurred speech, or vision changes.    Home Medications Prior to Admission medications   Medication Sig Start Date End Date Taking? Authorizing Provider  meclizine (ANTIVERT) 25 MG tablet Take 1 tablet (25 mg total) by mouth 3 (three) times daily as needed for dizziness. 05/20/23 06/19/23 Yes Maxwell Marion, PA-C  albuterol (VENTOLIN HFA) 108 (90 Base) MCG/ACT inhaler INHALE 2 PUFFS INTO THE LUNGS EVERY 6 (SIX) HOURS AS NEEDED FOR WHEEZING OR SHORTNESS OF BREATH. 12/20/21   Mort Sawyers, FNP  famotidine (PEPCID) 20 MG tablet Take 1 tablet (20 mg total) by mouth at bedtime. 09/17/20   Lorre Munroe, NP  fluticasone (FLONASE) 50 MCG/ACT nasal spray Place 2 sprays into both nostrils daily. 11/22/18   Joaquim Nam, MD  ibuprofen (ADVIL,MOTRIN) 600 MG tablet Take 1 tablet (600 mg total) by mouth every 8 (eight) hours as needed. 04/15/16   Vivi Barrack, DPM  montelukast (SINGULAIR) 10 MG tablet Take 1 tablet (10 mg total) by mouth at bedtime. 12/06/20   Bedsole, Amy E, MD  Multiple Vitamin (MULTIVITAMIN) tablet Take 1 tablet by mouth daily.    [provider]  ondansetron (ZOFRAN) 4 MG tablet Take 1 tablet (4 mg total) by mouth every 8 (eight) hours as needed for nausea or  vomiting. 09/12/22   Joaquim Nam, MD  traMADol Janean Sark) 50 MG tablet TAKE 2 TABLETS BY MOUTH 3 TIMES A DAY 02/18/23   Joaquim Nam, MD  Monte Fantasia INHUB 250-50 MCG/ACT AEPB INHALE 1 PUFF BU MOUTH TWICE A DAY 12/08/22   Joaquim Nam, MD      Allergies    Meloxicam    Review of Systems   Review of Systems  Neurological:  Positive for dizziness.  All other systems reviewed and are negative.   Physical Exam Updated Vital Signs BP (!) 177/75 (BP Location: Left Arm)   Pulse 97   Temp 97.6 F (36.4 C) (Oral)   Resp 16   SpO2 100%  Physical Exam Vitals and nursing note reviewed.  Constitutional:      General: He is not in acute distress.    Appearance: Normal appearance.  HENT:     Head: Normocephalic and atraumatic.     Mouth/Throat:     Mouth: Mucous membranes are moist.  Eyes:     Extraocular Movements: Extraocular movements intact.     Conjunctiva/sclera: Conjunctivae normal.     Pupils: Pupils are equal, round, and reactive to light.     Comments: Horizontal nystagmus on EOM  Cardiovascular:     Rate and Rhythm: Normal rate and regular rhythm.     Pulses: Normal pulses.     Heart sounds: Normal heart sounds.  Pulmonary:     Effort: Pulmonary effort is normal.     Breath sounds: Normal breath sounds.  Abdominal:     Palpations: Abdomen is soft.     Tenderness: There is no abdominal tenderness.  Skin:    General: Skin is warm and dry.     Findings: No rash.  Neurological:     General: No focal deficit present.     Mental Status: He is alert.     Sensory: No sensory deficit.     Motor: No weakness.     Gait: Gait normal.     Comments: Normal finger-to-nose touch   Psychiatric:        Mood and Affect: Mood normal.        Behavior: Behavior normal.    ED Results / Procedures / Treatments   Labs (all labs ordered are listed, but only abnormal results are displayed) Labs Reviewed  BASIC METABOLIC PANEL - Abnormal; Notable for the following components:       Result Value   CO2 20 (*)    Glucose, Bld 148 (*)    Anion gap 17 (*)    All other components within normal limits  CBC WITH DIFFERENTIAL/PLATELET - Abnormal; Notable for the following components:   WBC 15.6 (*)    MCHC 36.1 (*)    Neutro Abs 12.6 (*)    All other components within normal limits    EKG None  Radiology No results found.  Procedures Procedures: not indicated.   Medications Ordered in ED Medications  meclizine (ANTIVERT) tablet 25 mg (25 mg Oral Not Given 05/20/23 2128)  sodium chloride 0.9 % bolus 1,000 mL (has no administration in time range)  ondansetron (ZOFRAN) injection 4 mg (4 mg Intravenous Given 05/20/23 2128)    ED Course/ Medical Decision Making/ A&P                                 Medical Decision Making  This patient presents to the ED for concern of vertigo, this involves an extensive number of treatment options, and is a complaint that carries with it a high risk of complications and morbidity.   Differential diagnosis includes: BPPV, Mnire's disease, vestibular neuritis, electrolyte abnormality, dehydration, etc.   Comorbidities  See HPI above   Additional History  Additional history obtained from previous ED note.   Cardiac Monitoring / EKG  The patient was maintained on a cardiac monitor.  I personally viewed and interpreted the cardiac monitored which showed: sinus bradycardia with PACs with a heart rate of 56 bpm.   Lab Tests  I ordered and personally interpreted labs.  The pertinent results include:   Anion gap of 17, otherwise BMP, and CBC are within normal limits.   Problem List / ED Course / Critical Interventions / Medication Management  Vertigo I ordered medications including: Zofran, Meclizine, and NS for vertigo  I have reviewed the patients home medicines and have made adjustments as needed   Social Determinants of Health  Access to healthcare   Test / Admission - Considered  Discussed findings with  patient. Meclizine sent to the pharmacy. Patient care handed off to upcoming PA at shift change.       Final Clinical Impression(s) / ED Diagnoses Final diagnoses:  Vertigo    Rx / DC Orders ED Discharge Orders          Ordered    meclizine (ANTIVERT) 25  MG tablet  3 times daily PRN        05/20/23 2321              Maxwell Marion, PA-C 05/20/23 2339    Glyn Ade, MD 05/21/23 1600

## 2023-05-20 NOTE — ED Provider Notes (Signed)
[place] EMERGENCY DEPARTMENT AT [facility] Provider Note   CSN: [id number] Arrival date & time: 05/20/23  4098     History  Chief Complaint  Patient presents with   Dizziness   Weakness   Emesis   Nausea   Dental Pain    Randy King is a 57 y.o. male with medical history of asthma, back pain, elevated serum creatinine, knee pain, vertigo.  Patient presents to the ED for evaluation of dizziness.  States that yesterday had an episode of dizziness that lasted for maybe 1 minute to 5 minutes and then resolved.  He describes a sensation of the room was spinning.  Denies any chest pain, shortness of breath during this event.  Denies one-sided weakness or numbness, loss of consciousness.  States that after resolution of this event, he went on throughout his day.  States that he woke up this morning and had return of dizziness while he was driving to work.  Reported that he felt as if everything was spinning around him, this lasted for 1 or 2 hours and then resolved.  He states that this has occurred in the past, 1 year ago.  He was seen by his PCP for this and diagnosed with vertigo and given a medication which he cannot tell me the name of however reports that it does help with his dizzy spells.  He did not take this medication today for his dizzy spells.  He reports that he arrived at work where he was very sweaty and nauseous and they called EMS for him to come in to the ED.  He reports that he did have to put his head down in his chest at 1 point because the dizziness was so severe but denies losing consciousness.  Endorsing nausea and vomiting.  Denies any abdominal pain, diarrhea, dysuria.  Denies any one-sided weakness or numbness, headache, neck pain, dysphagia, slurred speech or facial droop.  States that currently he has no dizziness.   Dizziness Associated symptoms: nausea and vomiting   Associated symptoms: no chest pain, no headaches, no shortness of breath and no weakness    Weakness Associated symptoms: dizziness, nausea and vomiting   Associated symptoms: no chest pain, no fever, no headaches and no shortness of breath   Emesis Associated symptoms: no fever and no headaches   Dental Pain Associated symptoms: no fever, no headaches and no neck pain        Home Medications Prior to Admission medications   Medication Sig Start Date End Date Taking? Authorizing Provider  albuterol (VENTOLIN HFA) 108 (90 Base) MCG/ACT inhaler INHALE 2 PUFFS INTO THE LUNGS EVERY 6 (SIX) HOURS AS NEEDED FOR WHEEZING OR SHORTNESS OF BREATH. 12/20/21   Mort Sawyers, FNP  famotidine (PEPCID) 20 MG tablet Take 1 tablet (20 mg total) by mouth at bedtime. 09/17/20   Lorre Munroe, NP  fluticasone (FLONASE) 50 MCG/ACT nasal spray Place 2 sprays into both nostrils daily. 11/22/18   Joaquim Nam, MD  ibuprofen (ADVIL,MOTRIN) 600 MG tablet Take 1 tablet (600 mg total) by mouth every 8 (eight) hours as needed. 04/15/16   Vivi Barrack, DPM  montelukast (SINGULAIR) 10 MG tablet Take 1 tablet (10 mg total) by mouth at bedtime. 12/06/20   Bedsole, Amy E, MD  Multiple Vitamin (MULTIVITAMIN) tablet Take 1 tablet by mouth daily.    [provider]  ondansetron (ZOFRAN) 4 MG tablet Take 1 tablet (4 mg total) by mouth every 8 (eight) hours as  needed for nausea or vomiting. 09/12/22   Joaquim Nam, MD  traMADol Janean Sark) 50 MG tablet TAKE 2 TABLETS BY MOUTH 3 TIMES A DAY 02/18/23   Joaquim Nam, MD  Monte Fantasia INHUB 250-50 MCG/ACT AEPB INHALE 1 PUFF BU MOUTH TWICE A DAY 12/08/22   Joaquim Nam, MD      Allergies    Meloxicam    Review of Systems   Review of Systems  Constitutional:  Negative for fever.  Respiratory:  Negative for shortness of breath.   Cardiovascular:  Negative for chest pain.  Gastrointestinal:  Positive for nausea and vomiting.  Musculoskeletal:  Negative for neck pain.  Neurological:  Positive for dizziness. Negative for syncope, weakness, numbness and  headaches.  All other systems reviewed and are negative.   Physical Exam Updated Vital Signs BP (!) 152/79 (BP Location: Left Arm)   Pulse (!) 53   Temp 98.3 F (36.8 C)   Resp 18   Ht 5\' 10"  (1.778 m)   Wt 93 kg   SpO2 97%   BMI 29.41 kg/m  Physical Exam Vitals and nursing note reviewed.  Constitutional:      General: He is not in acute distress.    Appearance: Normal appearance. He is not ill-appearing, toxic-appearing or diaphoretic.  HENT:     Head: Normocephalic and atraumatic.     Nose: Nose normal.     Mouth/Throat:     Mouth: Mucous membranes are moist.     Pharynx: Oropharynx is clear.  Eyes:     Extraocular Movements: Extraocular movements intact.     Conjunctiva/sclera: Conjunctivae normal.     Pupils: Pupils are equal, round, and reactive to light.  Cardiovascular:     Rate and Rhythm: Normal rate and regular rhythm.  Pulmonary:     Effort: Pulmonary effort is normal.     Breath sounds: Normal breath sounds. No wheezing.  Abdominal:     General: Abdomen is flat. Bowel sounds are normal.     Palpations: Abdomen is soft.     Tenderness: There is no abdominal tenderness.  Musculoskeletal:     Cervical back: Normal range of motion and neck supple. No tenderness.  Skin:    General: Skin is warm and dry.     Capillary Refill: Capillary refill takes less than 2 seconds.  Neurological:     General: No focal deficit present.     Mental Status: He is alert and oriented to person, place, and time.     GCS: GCS eye subscore is 4. GCS verbal subscore is 5. GCS motor subscore is 6.     Cranial Nerves: Cranial nerves 2-12 are intact. No dysarthria.     Sensory: Sensation is intact. No sensory deficit.     Motor: Motor function is intact. No weakness.     Coordination: Coordination is intact.     ED Results / Procedures / Treatments   Labs (all labs ordered are listed, but only abnormal results are displayed) Labs Reviewed  BASIC METABOLIC PANEL - Abnormal;  Notable for the following components:      Result Value   Glucose, Bld 106 (*)    Creatinine, Ser 1.26 (*)    All other components within normal limits  CBC - Abnormal; Notable for the following components:   WBC 12.5 (*)    All other components within normal limits  URINALYSIS, ROUTINE W REFLEX MICROSCOPIC - Abnormal; Notable for the following components:   APPearance TURBID (*)  All other components within normal limits  CBG MONITORING, ED    EKG EKG Interpretation Date/Time:  Wednesday May 20 2023 09:29:02 EDT Ventricular Rate:  52 PR Interval:  158 QRS Duration:  90 QT Interval:  458 QTC Calculation: 425 R Axis:   40  Text Interpretation: Sinus bradycardia Otherwise normal ECG No previous ECGs available Confirmed by Lorre Nick ([id number]) on 05/20/2023 11:06:04 AM  Radiology No results found.  Procedures Procedures   Medications Ordered in ED Medications - No data to display  ED Course/ Medical Decision Making/ A&P  Medical Decision Making Amount and/or Complexity of Data Reviewed Labs: ordered.   57 year old male presents to the ED for evaluation.  Please see HPI for further details.  On examination patient is afebrile and nontachycardic.  His lung sounds are clear bilaterally, he is nonhypoxic.  His abdomen is soft and compressible throughout.  His neurological examination is at baseline without focal neurodeficits.  Intact finger-nose, heel-to-shin.  CN II through XII intact.  Equal grip strength upper extremities bilaterally.  5 out of 5 strength bilateral lower extremities.    Patient CBC with a leukocytosis to 12.5 which could be secondary to stress response.,  Baseline hemoglobin.  Metabolic panel with glucose 106, creatinine 1.26.  Creatinine is around baseline.  Urinalysis unremarkable.  EKG shows sinus bradycardia.  At this time, workup reassuring.  The patient was discussed with my attending Dr. Freida Busman who favors vertigo.  I agree with this.  Patient  has history of vertigo.  Will advise patient to follow-up with PCP.  Have encouraged him to return to the ED with any new or worsening signs or symptoms.  He is voiced understanding is in agreement.  Stable to discharge.   Final Clinical Impression(s) / ED Diagnoses Final diagnoses:  Dizziness    Rx / DC Orders ED Discharge Orders     None         Clent Ridges 05/20/23 1214    Lorre Nick, MD 05/21/23 [phone]

## 2023-05-20 NOTE — ED Provider Notes (Signed)
I saw and evaluated the patient, reviewed the resident's note and I agree with the findings and plan.  EKG Interpretation Date/Time:  Wednesday May 20 2023 09:29:02 EDT Ventricular Rate:  52 PR Interval:  158 QRS Duration:  90 QT Interval:  458 QTC Calculation: 425 R Axis:   40  Text Interpretation: Sinus bradycardia Otherwise normal ECG No previous ECGs available Confirmed by Lorre Nick ([id number]) on 05/20/2023 11:06:04 AM   Patient is EKG per interpretation shows sinus bradycardia. 56 year old male presents with acute onset of dizziness that occurred while driving.  Symptoms were worse when he moves his head and better with remaining still.  No focal weakness noted with.  History of vertigo and this felt similar.  Resolved on their own.  No focal weakness on exam.  Will treat with Antivert.   Lorre Nick, MD 05/20/23 [phone]

## 2023-05-20 NOTE — Discharge Instructions (Addendum)
I sent a prescription for Meclizine to your pharmacy. Take this as needed for dizziness.  I have also attached information on how to perform the Epley maneuver.  This can help resolve the vertigo.  Follow-up with your PCP in the next week for reevaluation of your symptoms.  Get help right away if: You are always dizzy. You faint. You get very bad headaches. You get a stiff neck. Bright light starts to bother you. You have trouble moving or talking. You feel weak in your hands, arms, or legs.

## 2023-05-20 NOTE — Discharge Instructions (Addendum)
It was a pleasure taking part in your care today.  As we discussed, your workup is reassuring.  Please follow-up with your doctor, Dr. Cheree Ditto, this week for further management and care.  Please return to the ED with any new or worsening signs or symptoms.  Please see attached work note.

## 2023-05-21 MED ORDER — MECLIZINE HCL 25 MG PO TABS
25.0000 mg | ORAL_TABLET | Freq: Once | ORAL | Status: AC
  Administered 2023-05-21: 25 mg via ORAL
  Filled 2023-05-21: qty 1

## 2023-05-22 ENCOUNTER — Encounter: Payer: Self-pay | Admitting: Family Medicine

## 2023-05-22 ENCOUNTER — Ambulatory Visit: Payer: 59 | Admitting: Family Medicine

## 2023-05-22 ENCOUNTER — Other Ambulatory Visit: Payer: Self-pay | Admitting: Family Medicine

## 2023-05-22 VITALS — BP 138/84 | HR 78 | Temp 98.5°F | Ht 70.0 in | Wt 208.0 lb

## 2023-05-22 DIAGNOSIS — Z1212 Encounter for screening for malignant neoplasm of rectum: Secondary | ICD-10-CM

## 2023-05-22 DIAGNOSIS — R42 Dizziness and giddiness: Secondary | ICD-10-CM | POA: Diagnosis not present

## 2023-05-22 DIAGNOSIS — H8110 Benign paroxysmal vertigo, unspecified ear: Secondary | ICD-10-CM

## 2023-05-22 DIAGNOSIS — Z1211 Encounter for screening for malignant neoplasm of colon: Secondary | ICD-10-CM

## 2023-05-22 MED ORDER — MECLIZINE HCL 25 MG PO TABS: 25.0000 mg | ORAL_TABLET | Freq: Three times a day (TID) | ORAL | 0 refills | Status: AC | PRN

## 2023-05-22 NOTE — Patient Instructions (Addendum)
  Use the meclizine as needed along with zofran.   Take care.  Glad to see you. Update me as needed.

## 2023-05-22 NOTE — Progress Notes (Unsigned)
He was seen at ER after sx started at work.  Quick onset, room spinning, quick relief.  Then the next AM had similar sx but not as severe.  Then returned again for 3rd episode.  EMS called and transported to ER.  Same rotation pattern each time.  Then had 4th episode, recheck at ER.  No LOC.  No new hearing loss.  No sx now.

## 2023-05-24 DIAGNOSIS — R42 Dizziness and giddiness: Secondary | ICD-10-CM | POA: Insufficient documentation

## 2023-05-24 NOTE — Assessment & Plan Note (Signed)
Likely BPV.  Pathophysiology discussed with patient.  Home exercise instructions discussed and given to patient.  Can use Zofran and meclizine as needed.  Routine cautions given.  No symptoms now.  Can return to work next week if he is feeling better in the meantime.  Update me as needed.  He agrees to plan.  35 minutes were devoted to patient care in this encounter (this includes time spent reviewing the patient's file/history, interviewing and examining the patient, counseling/reviewing plan with patient).

## 2023-05-25 ENCOUNTER — Other Ambulatory Visit: Payer: Self-pay | Admitting: Family Medicine

## 2023-08-17 ENCOUNTER — Encounter: Payer: Self-pay | Admitting: Family Medicine

## 2023-08-17 ENCOUNTER — Ambulatory Visit: Payer: 59 | Admitting: Family Medicine

## 2023-08-17 VITALS — BP 140/72 | HR 77 | Temp 97.9°F | Ht 70.0 in | Wt 218.4 lb

## 2023-08-17 DIAGNOSIS — J208 Acute bronchitis due to other specified organisms: Secondary | ICD-10-CM

## 2023-08-17 DIAGNOSIS — J45901 Unspecified asthma with (acute) exacerbation: Secondary | ICD-10-CM | POA: Diagnosis not present

## 2023-08-17 MED ORDER — DOXYCYCLINE HYCLATE 100 MG PO TABS: 100.0000 mg | ORAL_TABLET | Freq: Two times a day (BID) | ORAL | 0 refills | Status: DC

## 2023-08-18 ENCOUNTER — Ambulatory Visit: Payer: Self-pay | Admitting: Family Medicine

## 2023-08-18 MED ORDER — POLYMYXIN B-TRIMETHOPRIM 10000-0.1 UNIT/ML-% OP SOLN
1.0000 [drp] | Freq: Four times a day (QID) | OPHTHALMIC | 0 refills | Status: AC
Start: 2023-08-18 — End: 2023-08-25

## 2023-08-18 NOTE — Telephone Encounter (Signed)
   Chief Complaint: left eye discharge Symptoms: yellow discharge from left eye, eye redness, eyelid swelling, runny nose, congestion Frequency: since yesterday Pertinent Negatives: Patient denies fever, itching, vision changes Disposition: [] ED /[x] Urgent Care (no appt availability in office) / [] Appointment(In office/virtual)/ []  [facility]/ [] Home Care/ [x] Refused Recommended Disposition /[] [facility]/ []  Follow-up with PCP Additional Notes: Patient reports he has been experiencing yellow left eye discharge, eye redness, eyelid swelling, and congestion since his appt in office yesterday. Patient reports he think that his symptoms might be due to his congestion which he was seen for yesterday. Per protocol, this RN attempted in office appt, no availability. This RN recommended UC to which patient refused. Patient is requesting PCP to call him in eye drops since he was just seen in office yesterday. This RN advised she would send request to proper person for follow-up and advised patient to call back or go to UC with worsening symptoms. Patient verbalized understanding.   Copied from CRM [id number]. Topic: Clinical - Pink Word Triage >> Aug 18, 2023  9:50 AM Randy King wrote: Reason for Triage: Patient is having drainage in his left eye. There is no swelling and only sight irritation. It started yesterday afternoon, about 2 hours after he returned from his doctor's appointment. He is not sure if it is due to the cold he is dealing with. Reason for Disposition  [1] Eye with yellow or green discharge, or eyelashes stick together AND [2] NO PCP standing order to call in antibiotic eye drops   (Exception: [place]; continue triage.)  Answer Assessment - Initial Assessment Questions 1. EYE DISCHARGE: Is the discharge in one or both eyes? What color is it? How much is there? When did the discharge start?      Left eye 2. REDNESS OF SCLERA: Is the redness in one or both eyes?  When did the redness start?      A little red 3. EYELIDS: Are the eyelids red or swollen? If Yes, ask: How much?      Red and swollen 4. VISION: Is there any difficulty seeing clearly?      See clearly 5. PAIN: Is there any pain? If Yes, ask: How bad is it? (Scale 1-10; or mild, moderate, severe)    - MILD (1-3): doesn't interfere with normal activities     - MODERATE (4-7): interferes with normal activities or awakens from sleep    - SEVERE (8-10): excruciating pain, unable to do any normal activities       mild 6. CONTACT LENS: Do you wear contacts?     no 7. OTHER SYMPTOMS: Do you have any other symptoms? (e.g., fever, runny nose, cough)     Runny nose, cough  Protocols used: Eye - Pus or Discharge-A-AH

## 2023-08-18 NOTE — Telephone Encounter (Signed)
 See my chart message

## 2023-08-18 NOTE — Addendum Note (Signed)
 Addended by: Joaquim Nam on: 08/18/2023 09:45 PM   Modules accepted: Orders

## 2023-08-29 ENCOUNTER — Other Ambulatory Visit: Payer: Self-pay | Admitting: Family Medicine

## 2023-08-31 NOTE — Telephone Encounter (Signed)
 Refill request for  traMADol (ULTRAM) 50 MG tablet   LOV - 05/22/23 Next OV - not scheduled Last refill - 02/18/23 #180/5

## 2023-08-31 NOTE — Telephone Encounter (Signed)
 Sent. Thanks.

## 2023-12-01 LAB — COLOGUARD: COLOGUARD: NEGATIVE

## 2023-12-06 ENCOUNTER — Encounter: Payer: Self-pay | Admitting: Family Medicine

## 2024-02-12 ENCOUNTER — Other Ambulatory Visit: Payer: Self-pay | Admitting: Family Medicine

## 2024-02-27 ENCOUNTER — Other Ambulatory Visit: Payer: Self-pay | Admitting: Family Medicine

## 2024-03-01 ENCOUNTER — Telehealth: Payer: Self-pay | Admitting: Family Medicine

## 2024-03-01 NOTE — Telephone Encounter (Unsigned)
 Copied from CRM [id number]. Topic: Clinical - Medication Refill >> Mar 01, 2024  1:16 PM Winona R wrote: Medication: traMADol  (ULTRAM ) 50 MG tablet  Has the patient contacted their pharmacy? Yes pharmacy stated they havent heard back from the dr.   This is the patient's preferred pharmacy:  [facility] [phone] [place] [place] - [address] [place] [address] Phone: [phone] Fax: [phone]  Is this the correct pharmacy for this prescription? Yes If no, delete pharmacy and type the correct one.   Has the prescription been filled recently? Yes  Is the patient out of the medication? Yes  Has the patient been seen for an appointment in the last year OR does the patient have an upcoming appointment? Yes 05/22/2023  Can we respond through MyChart? No  Agent: Please be advised that Rx refills may take up to 3 business days. We ask that you follow-up with your pharmacy.

## 2024-03-02 NOTE — Telephone Encounter (Signed)
 Copied from CRM [id number]. Topic: Clinical - Medication Question >> Mar 02, 2024 10:56 AM Randy King DEL wrote: Reason for CRM: Patient called I to ask about the status of refill for medication traMADol  (ULTRAM ) 50 MG tablet?He stated that he plans to leave for out of town today,but it waiting on his prescription to be filled

## 2024-03-02 NOTE — Telephone Encounter (Signed)
Patient notified refill has been sent. 

## 2024-07-03 ENCOUNTER — Other Ambulatory Visit: Payer: Self-pay | Admitting: Family Medicine

## 2024-07-04 NOTE — Telephone Encounter (Signed)
 Name of Medication:  Tramadol  Name of Pharmacy:  [facility] Last Fill or Written Date and Quantity:  05/31/24, #180 Last Office Visit and Type:  05/22/23, vertigo Next Office Visit and Type:  none Last Controlled Substance Agreement Date:  none Last UDS:  none

## 2024-07-04 NOTE — Telephone Encounter (Signed)
 Sent. Thanks.

## 2024-07-19 ENCOUNTER — Ambulatory Visit: Payer: Self-pay

## 2024-07-19 NOTE — Progress Notes (Unsigned)
     Hien Perreira T. Irlanda Croghan, MD, CAQ Sports Medicine [facility] [address] [place], [address]  Phone: [phone]  FAX: [phone]  Randy King - 58 y.o. male  MRN [id number]  Date of Birth: 02-29-1966  Date: 07/20/2024  PCP: Cleatus Arlyss RAMAN, MD  Referral: Cleatus Arlyss RAMAN, MD  No chief complaint on file.  Subjective:   Randy King is a 58 y.o. very pleasant male patient with There is no height or weight on file to calculate BMI. who presents with the following:  Discussed the use of AI scribe software for clinical note transcription with the patient, who gave verbal consent to proceed.  Patient presents with a swollen right leg. History of Present Illness     Review of Systems is noted in the HPI, as appropriate  Objective:   There were no vitals taken for this visit.  GEN: No acute distress; alert,appropriate. PULM: Breathing comfortably in no respiratory distress PSYCH: Normally interactive.   Laboratory and Imaging Data:  Assessment and Plan:   No diagnosis found. Assessment & Plan   Medication Management during today's office visit: No orders of the defined types were placed in this encounter.  There are no discontinued medications.  Orders placed today for conditions managed today: No orders of the defined types were placed in this encounter.   Disposition: No follow-ups on file.  [employer] One speech-to-text software was used for transcription in this dictation.  Possible transcriptional errors can occur using Animal nutritionist.   Signed,  Jacques DASEN. Emmamarie Kluender, MD   Outpatient Encounter Medications as of 07/20/2024  Medication Sig   albuterol  (VENTOLIN  HFA) 108 (90 Base) MCG/ACT inhaler INHALE 2 PUFFS INTO THE LUNGS EVERY 6 (SIX) HOURS AS NEEDED FOR WHEEZING OR SHORTNESS OF BREATH.   doxycycline  (VIBRA -TABS) 100 MG tablet Take 1 tablet (100 mg total) by mouth 2 (two) times daily.   famotidine  (PEPCID )  20 MG tablet Take 1 tablet (20 mg total) by mouth at bedtime.   fluticasone  (FLONASE ) 50 MCG/ACT nasal spray Place 2 sprays into both nostrils daily.   ibuprofen  (ADVIL ,MOTRIN ) 600 MG tablet Take 1 tablet (600 mg total) by mouth every 8 (eight) hours as needed.   montelukast  (SINGULAIR ) 10 MG tablet Take 1 tablet (10 mg total) by mouth at bedtime.   Multiple Vitamin (MULTIVITAMIN) tablet Take 1 tablet by mouth daily.   ondansetron  (ZOFRAN ) 4 MG tablet TAKE 1 TABLET BY MOUTH EVERY 8 HOURS AS NEEDED FOR NAUSEA AND VOMITING   traMADol  (ULTRAM ) 50 MG tablet TAKE 2 TABLETS BY MOUTH 3 TIMES A DAY   WIXELA INHUB 250-50 MCG/ACT AEPB INHALE 1 PUFF BY MOUTH TWICE A DAY   No facility-administered encounter medications on file as of 07/20/2024.

## 2024-07-19 NOTE — Telephone Encounter (Signed)
 FYI Only or Action Required?: FYI only for provider: appointment scheduled on 07/20/24 based on appt availability.  Patient was last seen in primary care on 08/17/2023 by Watt Mirza, MD.  Called Nurse Triage reporting Leg Swelling.  Symptoms began several weeks ago.  Interventions attempted: Nothing.  Symptoms are: unchanged.  Triage Disposition: See HCP Within 4 Hours (Or PCP Triage)  Patient/caregiver understands and will follow disposition?: Yes   Copied from CRM #[id number]. Topic: Clinical - Red Word Triage >> Jul 19, 2024  9:53 AM Rosina BIRCH wrote: Red Word that prompted transfer to Nurse Triage: swollen calf and ankle for two weeks   Reason for Disposition  [1] Thigh, calf, or ankle swelling AND [2] only 1 side  Answer Assessment - Initial Assessment Questions Pt states that his R calf developed swelling 2 weeks ago then he noticed this weekend that his ankle was starting to swell as well. Pt reports area is warm but not hot and sore. He denies any skin discoloration, chest pain, fever or SOB. He states he does not remember having any type of injury to area in the past 2 weeks. He does not take any medication other than toradol. Discussed importance of being seen by PCP, pt scheduled with alternative provider based on appt availability. Pt educated on red flag symptoms of chest pain, SOB and fever and to call EMS. He voiced understanding. Appointment scheduled for evaluation. Patient agrees with plan of care, and will call back if anything changes, or if symptoms worsen.    1. ONSET: When did the swelling start? (e.g., minutes, hours, days)     Calf swelling started 2 weeks ago; ankle started swelling this weekend   2. LOCATION: What part of the leg is swollen?  Are both legs swollen or just one leg?     Right side   3. SEVERITY: How bad is the swelling? (e.g., localized; mild, moderate, severe)     Mild swelling when comparing left to right   4. REDNESS: Is  there redness or signs of infection?     None   5. PAIN: Is the swelling painful to touch? If Yes, ask: How painful is it?   (Scale 1-10; mild, moderate or severe)     Not pain, sorenes  6. FEVER: Do you have a fever? If Yes, ask: What is it, how was it measured, and when did it start?      None  7. CAUSE: What do you think is causing the leg swelling?     Unknown; thought at first it may be his socks are too tight because he wears tall socks with cowboy boots   8. MEDICAL HISTORY: Do you have a history of blood clots (e.g., DVT), cancer, heart failure, kidney disease, or liver failure?   None       9. RECURRENT SYMPTOM: Have you had leg swelling before? If Yes, ask: When was the last time? What happened that time?     None   10. OTHER SYMPTOMS: Do you have any other symptoms? (e.g., chest pain, difficulty breathing)       None  Protocols used: Leg Swelling and Edema-A-AH

## 2024-07-19 NOTE — Telephone Encounter (Signed)
Thanks for getting him scheduled.  

## 2024-07-20 ENCOUNTER — Ambulatory Visit: Admitting: Family Medicine

## 2024-07-20 ENCOUNTER — Ambulatory Visit
Admission: RE | Admit: 2024-07-20 | Discharge: 2024-07-20 | Disposition: A | Discharge status: Home / Self Care | Source: Ambulatory Visit | Attending: Family Medicine | Admitting: Family Medicine

## 2024-07-20 ENCOUNTER — Encounter: Payer: Self-pay | Admitting: Family Medicine

## 2024-07-20 VITALS — BP 148/72 | HR 76 | Temp 98.2°F | Ht 70.0 in | Wt 214.5 lb

## 2024-07-20 DIAGNOSIS — I82401 Acute embolism and thrombosis of unspecified deep veins of right lower extremity: Secondary | ICD-10-CM

## 2024-07-20 DIAGNOSIS — M79604 Pain in right leg: Secondary | ICD-10-CM

## 2024-07-20 DIAGNOSIS — R600 Localized edema: Secondary | ICD-10-CM | POA: Diagnosis present

## 2024-07-20 MED ORDER — APIXABAN (ELIQUIS) VTE STARTER PACK (10MG AND 5MG): ORAL_TABLET | ORAL | 0 refills | Status: DC

## 2024-07-20 MED ORDER — APIXABAN 5 MG PO TABS: 5.0000 mg | ORAL_TABLET | Freq: Two times a day (BID) | ORAL | 3 refills | Status: DC

## 2024-07-20 NOTE — Addendum Note (Signed)
 Addended by: WATT MIRZA on: 07/20/2024 03:45 PM   Modules accepted: Orders

## 2024-07-21 ENCOUNTER — Ambulatory Visit: Payer: Self-pay | Admitting: Family Medicine

## 2024-07-21 DIAGNOSIS — Z125 Encounter for screening for malignant neoplasm of prostate: Secondary | ICD-10-CM

## 2024-07-21 DIAGNOSIS — I82401 Acute embolism and thrombosis of unspecified deep veins of right lower extremity: Secondary | ICD-10-CM

## 2024-08-05 ENCOUNTER — Ambulatory Visit: Admitting: Family Medicine

## 2024-08-05 ENCOUNTER — Encounter: Payer: Self-pay | Admitting: Family Medicine

## 2024-08-05 VITALS — BP 146/90 | HR 69 | Temp 98.2°F | Ht 70.0 in | Wt 216.0 lb

## 2024-08-05 DIAGNOSIS — I82401 Acute embolism and thrombosis of unspecified deep veins of right lower extremity: Secondary | ICD-10-CM

## 2024-08-05 DIAGNOSIS — Z125 Encounter for screening for malignant neoplasm of prostate: Secondary | ICD-10-CM | POA: Diagnosis not present

## 2024-08-05 DIAGNOSIS — E785 Hyperlipidemia, unspecified: Secondary | ICD-10-CM | POA: Diagnosis not present

## 2024-08-05 LAB — CBC WITH DIFFERENTIAL/PLATELET
Basophils Absolute: 0 K/uL (ref 0.0–0.1)
Basophils Relative: 0.5 % (ref 0.0–3.0)
Eosinophils Absolute: 0.2 K/uL (ref 0.0–0.7)
Eosinophils Relative: 3.2 % (ref 0.0–5.0)
HCT: 43.6 % (ref 39.0–52.0)
Hemoglobin: 15.2 g/dL (ref 13.0–17.0)
Lymphocytes Relative: 31.5 % (ref 12.0–46.0)
Lymphs Abs: 1.9 K/uL (ref 0.7–4.0)
MCHC: 34.9 g/dL (ref 30.0–36.0)
MCV: 90.2 fl (ref 78.0–100.0)
Monocytes Absolute: 0.7 K/uL (ref 0.1–1.0)
Monocytes Relative: 11.4 % (ref 3.0–12.0)
Neutro Abs: 3.3 K/uL (ref 1.4–7.7)
Neutrophils Relative %: 53.4 % (ref 43.0–77.0)
Platelets: 223 K/uL (ref 150.0–400.0)
RBC: 4.84 Mil/uL (ref 4.22–5.81)
RDW: 14.9 % (ref 11.5–15.5)
WBC: 6.2 K/uL (ref 4.0–10.5)

## 2024-08-05 LAB — LIPID PANEL
Cholesterol: 273 mg/dL — ABNORMAL HIGH (ref 28–200)
HDL: 54.9 mg/dL
LDL Cholesterol: 202 mg/dL — ABNORMAL HIGH (ref 10–99)
NonHDL: 217.88
Total CHOL/HDL Ratio: 5
Triglycerides: 80 mg/dL (ref 10.0–149.0)
VLDL: 16 mg/dL (ref 0.0–40.0)

## 2024-08-05 LAB — COMPREHENSIVE METABOLIC PANEL WITH GFR
ALT: 29 U/L (ref 3–53)
AST: 24 U/L (ref 5–37)
Albumin: 4.2 g/dL (ref 3.5–5.2)
Alkaline Phosphatase: 33 U/L — ABNORMAL LOW (ref 39–117)
BUN: 19 mg/dL (ref 6–23)
CO2: 28 meq/L (ref 19–32)
Calcium: 9.2 mg/dL (ref 8.4–10.5)
Chloride: 106 meq/L (ref 96–112)
Creatinine, Ser: 1.21 mg/dL (ref 0.40–1.50)
GFR: 65.3 mL/min
Glucose, Bld: 106 mg/dL — ABNORMAL HIGH (ref 70–99)
Potassium: 4.6 meq/L (ref 3.5–5.1)
Sodium: 140 meq/L (ref 135–145)
Total Bilirubin: 0.4 mg/dL (ref 0.2–1.2)
Total Protein: 6.3 g/dL (ref 6.0–8.3)

## 2024-08-05 LAB — PSA: PSA: 1.72 ng/mL (ref 0.10–4.00)

## 2024-08-05 NOTE — Progress Notes (Unsigned)
 BP elevated today. That is atypical for patient.   D/w pt about DVT path/phys.  On eliquis .  Swelling is better.   No h/o DVT.  No FH.  No antecedent illness or trigger known.  He was feeling well prior to dx/sx onset and doesn't have known cancer.    Cologuard neg 2025.  No CP.  Not SOB.  Meds, vitals, and allergies reviewed.   ROS: Per HPI unless specifically indicated in ROS section   R calf soft, 43 cm circ L calf soft, 41 cm circ

## 2024-08-05 NOTE — Patient Instructions (Addendum)
 Light exercise until leg swelling resolved.   Then gradually return to full exercise.  Let me know if you can't get the blood thinner filled.   We need to make plans about duration of treatment after I see your labs.   We may need to have you see the blood clinic in the future.   Go to the lab on the way out.   If you have mychart we'll likely use that to update you.    Take care.  Glad to see you.  If your BP stays above 140/90, then let me know.

## 2024-08-09 DIAGNOSIS — I82401 Acute embolism and thrombosis of unspecified deep veins of right lower extremity: Secondary | ICD-10-CM | POA: Insufficient documentation

## 2024-08-09 NOTE — Assessment & Plan Note (Signed)
 Discussed options.  Anticoagulated.   DVT pathophysiology discussed with patient.   Light exercise until leg swelling resolved.   Then gradually return to full exercise.  He can update me if he cannot get his anticoagulant filled. We need to make plans about duration of treatment after I see his labs.   Discussed that we may need to refer to hematology.  Discussed rationale for checking routine labs today and also checking hypercoagulable panel.  See notes on labs.

## 2024-08-10 ENCOUNTER — Ambulatory Visit: Payer: Self-pay | Admitting: Family Medicine

## 2024-08-10 DIAGNOSIS — I82401 Acute embolism and thrombosis of unspecified deep veins of right lower extremity: Secondary | ICD-10-CM

## 2024-08-18 LAB — HYPERCOAGULABLE PANEL, COMPREHENSIVE
APTT: 31.2 s — ABNORMAL HIGH
AT III Act/Nor PPP Chro: 118 %
Act. Prt C Resist w/FV Defic.: 2.9 ratio
Anticardiolipin Ab, IgG: <10 [GPL'U]
Anticardiolipin Ab, IgM: <10 [MPL'U]
Beta-2 Glycoprotein I, IgA: <10 SAU
Beta-2 Glycoprotein I, IgG: <10 SGU
Beta-2 Glycoprotein I, IgM: <10 SMU
DRVVT Confirm Seconds: 41.4 s
DRVVT Ratio: 1.2 ratio
DRVVT Screen Seconds: 76.2 s — ABNORMAL HIGH
Factor VII Antigen**: 112 %
Factor VIII Activity: 68 %
Hexagonal Phospholipid Neutral: 3 s
Homocysteine: 9.4 umol/L
Prot C Ag Act/Nor PPP Imm: 73 %
Prot S Ag Act/Nor PPP Imm: 93 %
Protein C Ag/FVII Ag Ratio**: 0.7 ratio
Protein S Ag/FVII Ag Ratio**: 0.8 ratio

## 2024-08-24 ENCOUNTER — Encounter: Payer: Self-pay | Admitting: Internal Medicine

## 2024-08-30 ENCOUNTER — Inpatient Hospital Stay: Attending: Internal Medicine | Admitting: Internal Medicine

## 2024-08-30 ENCOUNTER — Encounter: Payer: Self-pay | Admitting: Internal Medicine

## 2024-08-30 ENCOUNTER — Inpatient Hospital Stay

## 2024-08-30 VITALS — BP 160/87 | HR 76 | Temp 97.3°F | Resp 16 | Ht 70.0 in | Wt 217.6 lb

## 2024-08-30 DIAGNOSIS — I82401 Acute embolism and thrombosis of unspecified deep veins of right lower extremity: Secondary | ICD-10-CM

## 2024-08-30 DIAGNOSIS — Z7901 Long term (current) use of anticoagulants: Secondary | ICD-10-CM | POA: Diagnosis not present

## 2024-08-30 DIAGNOSIS — Z8042 Family history of malignant neoplasm of prostate: Secondary | ICD-10-CM | POA: Insufficient documentation

## 2024-08-30 DIAGNOSIS — I82431 Acute embolism and thrombosis of right popliteal vein: Secondary | ICD-10-CM | POA: Diagnosis not present

## 2024-08-30 NOTE — Assessment & Plan Note (Addendum)
#   DEC 3rd, 2025- Positive for deep vein thrombosis in the right popliteal vein (nonocclusive) and 1 of 2 paired posterior tibial veins- on ELIQUIS  5 mg BID.      Acute deep vein thrombosis of the right lower extremity Recent unprovoked non-occlusive DVT in right popliteal and posterior tibial veins. NO Family history of Factor V Leiden OR  thromboembolic events.   # Tolerating apixaban  without bleeding. Persistent but improved swelling. Six months anticoagulation indicated. Hypercoagulable workup deferred until post-anticoagulation. No malignancy or provoking factors. Emphasized bleeding risk.  - Continued apixaban  5 mg BID through May 2026 for six months anticoagulation.  - Recommended compression stockings for edema.  - Provided education on medication safety with apixaban  and bleeding risk, especially with fall risk.  - Ordered hypercoagulable workup one month post-anticoagulation (end of June 2026).  - Scheduled follow-up two weeks post-blood work to review results and assess ongoing anticoagulation need.  - Coordinated Eliquis  refills with primary provider.     Thank you Dr Cleatus MD. for allowing me to participate in the care of your pleasant patient. Please do not hesitate to contact me with questions or concerns in the interim.  # DISPOSITION: # No labs today-  # follow up in 6/end of june months- MD; 2 weeks prior- ordered labs-Dr.B

## 2024-08-30 NOTE — Progress Notes (Signed)
 [facility] CONSULT NOTE  Patient Care Team: Cleatus Arlyss RAMAN, MD as PCP - General Rennie Cindy SAUNDERS, MD as Consulting Physician (Oncology)  CHIEF COMPLAINTS/PURPOSE OF CONSULTATION: DVT right LE.   #  Oncology History   No problem history exists.    HISTORY OF PRESENTING ILLNESS:  Randy King 59 y.o.  male with no prior history of thrombo-embolism has been referred to us  regarding recent LEFT LE DVT.   Discussed the use of AI scribe software for clinical note transcription with the patient, who gave verbal consent to proceed.  History of Present Illness   Randy King is a 59 year old male who presents for evaluation and management of an acute, non-occlusive right lower extremity deep vein thrombosis (DVT) while on apixaban .  In late November 2025, he developed swelling of the right calf and ankle, initially attributing the increased calf size to minor trauma at the gym. The swelling progressed to involve the right ankle. He describes only mild discomfort, similar to post-exercise soreness, without significant pain, cramping, or diaphoresis. He denies chest pain, dyspnea, or other systemic symptoms.  He reports no recent immobility, prolonged travel, surgery, or trauma preceding the event. He remains physically active, working in a warehouse with frequent standing and movement. He has no prior history of venous thromboembolism.  A right lower extremity ultrasound in early December 2025 was performed, and the patient was informed that there was a blood clot in the right popliteal vein and one of the paired posterior tibial veins. He was started on apixaban  5 mg twice daily; he reports that much of the swelling has gone down, but some mild swelling remains. He has not experienced any bleeding complications and continues anticoagulation without difficulty.  He underwent negative stool-based colorectal cancer screening approximately seven months ago. Additional history  includes vertigo, previously treated with meclizine , and hyperlipidemia.      With regards risk factors: Previous history of DVT/PE: none Long distance travel- > 8 hours: none Recent surgery GA [< 3 months]; Immobilization/trauma: none Obesity:none Smoking: none Family history: none   Cancer screening: guardant testing- NEG.   Review of Systems  Constitutional:  Negative for chills, diaphoresis, fever, malaise/fatigue and weight loss.  HENT:  Negative for nosebleeds and sore throat.   Eyes:  Negative for double vision.  Respiratory:  Negative for cough, hemoptysis, sputum production, shortness of breath and wheezing.   Cardiovascular:  Negative for chest pain, palpitations, orthopnea and leg swelling.  Gastrointestinal:  Negative for abdominal pain, blood in stool, constipation, diarrhea, heartburn, melena, nausea and vomiting.  Genitourinary:  Negative for dysuria, frequency and urgency.  Musculoskeletal:  Negative for back pain and joint pain.  Skin: Negative.  Negative for itching and rash.  Neurological:  Negative for dizziness, tingling, focal weakness, weakness and headaches.  Endo/Heme/Allergies:  Does not bruise/bleed easily.  Psychiatric/Behavioral:  Negative for depression. The patient is not nervous/anxious and does not have insomnia.      MEDICAL HISTORY:  Past Medical History:  Diagnosis Date   Asthma    Back pain    frequent   Elevated serum creatinine    history of, likely related to muscle mass and not true renal disease   Hyperlipidemia    Knee pain    frequent    SURGICAL HISTORY: Past Surgical History:  Procedure Laterality Date   LIPOMA EXCISION  12/29/08   right posterior neck, Dr. 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   Enjoys watching bull riding.   Social Drivers of Health   Tobacco Use: Low Risk (08/30/2024)   Patient History    Smoking Tobacco Use: Never    Smokeless Tobacco Use: Never    Passive Exposure: Not on file  Financial Resource Strain: Not on file  Food Insecurity: No Food Insecurity (08/30/2024)   Epic    Worried About Programme Researcher, Broadcasting/film/video in the Last Year: Never true    Ran Out of Food in the Last Year: Never true  Transportation Needs: No Transportation Needs (08/30/2024)   Epic    Lack of Transportation (Medical): No    Lack of Transportation (Non-Medical): No  Physical Activity: Not on file  Stress: Not on file  Social Connections: Not on file  Intimate Partner Violence: Not At Risk (08/30/2024)   Epic    Fear of Current or Ex-Partner: No    Emotionally Abused: No    Physically Abused: No    Sexually Abused: No  Depression (PHQ2-9): Low Risk (08/30/2024)   Depression (PHQ2-9)    PHQ-2 Score: 0  Alcohol Screen: Not on file  Housing: Low Risk (08/30/2024)   Epic    Unable to Pay for Housing in the Last Year: No    Number of Times Moved in the Last Year: 0    Homeless in the Last Year: No  Utilities: Not At Risk (08/30/2024)   Epic    Threatened with loss of utilities: No  Health Literacy: Not on file    FAMILY HISTORY: Family History  Problem Relation Age of Onset   Diabetes Mother    Cancer Father        prostate, dx'd in his 30's   Prostate cancer Father    Alcohol abuse Maternal Uncle    Stroke Neg Hx    Depression Neg Hx    Drug  abuse Neg Hx    Colon cancer Neg Hx     ALLERGIES:  is allergic to meloxicam .  MEDICATIONS:  Current Outpatient Medications  Medication Sig Dispense Refill   apixaban  (ELIQUIS ) 5 MG TABS tablet Take 1 tablet (5 mg total) by mouth 2 (two) times daily. 60 tablet 3   famotidine  (PEPCID ) 20 MG tablet Take 1 tablet (20 mg total) by mouth at bedtime. (Patient taking differently: Take 20 mg by mouth as needed.) 90 tablet 1   Multiple Vitamin (MULTIVITAMIN) tablet Take 1 tablet by mouth daily.     traMADol  (ULTRAM ) 50 MG tablet TAKE 2 TABLETS BY MOUTH 3 TIMES A DAY 180 tablet 1   WIXELA INHUB 250-50 MCG/ACT AEPB INHALE 1 PUFF BY MOUTH TWICE A DAY 180 each 2   No current facility-administered medications for this visit.  PHYSICAL EXAMINATION:  Vitals:   08/30/24 1056  BP: (!) 160/87  Pulse: 76  Resp: 16  Temp: (!) 97.3 F (36.3 C)  SpO2: 100%   Filed Weights   08/30/24 1056  Weight: 217 lb 9.6 oz (98.7 kg)    Physical Exam Vitals and nursing note reviewed.  HENT:     Head: Normocephalic and atraumatic.     Mouth/Throat:     Pharynx: Oropharynx is clear.  Eyes:     Extraocular Movements: Extraocular movements intact.     Pupils: Pupils are equal, round, and reactive to light.  Cardiovascular:     Rate and Rhythm: Normal rate and regular rhythm.  Pulmonary:     Comments: Decreased breath sounds bilaterally.  Abdominal:     Palpations: Abdomen is soft.  Musculoskeletal:        General: Normal range of motion.     Cervical back: Normal range of motion.  Skin:    General: Skin is warm.  Neurological:     General: No focal deficit present.     Mental Status: He is alert and oriented to person, place, and time.  Psychiatric:        Behavior: Behavior normal.        Judgment: Judgment normal.      LABORATORY DATA:  I have reviewed the data as listed Lab Results  Component Value Date   WBC 6.2 08/05/2024   HGB 15.2 08/05/2024   HCT 43.6 08/05/2024   MCV  90.2 08/05/2024   PLT 223.0 08/05/2024   Recent Labs    08/05/24 0837  NA 140  K 4.6  CL 106  CO2 28  GLUCOSE 106*  BUN 19  CREATININE 1.21  CALCIUM 9.2  PROT 6.3  ALBUMIN 4.2  AST 24  ALT 29  ALKPHOS 33*  BILITOT 0.4    RADIOGRAPHIC STUDIES: I have personally reviewed the radiological images as listed and agreed with the findings in the report. No results found.  ASSESSMENT & PLAN:   Leg DVT (deep venous thromboembolism), acute, right (HCC) # DEC 3rd, 2025- Positive for deep vein thrombosis in the right popliteal vein (nonocclusive) and 1 of 2 paired posterior tibial veins- on ELIQUIS  5 mg BID.      Acute deep vein thrombosis of the right lower extremity Recent unprovoked non-occlusive DVT in right popliteal and posterior tibial veins. NO Family history of Factor V Leiden OR  thromboembolic events.   # Tolerating apixaban  without bleeding. Persistent but improved swelling. Six months anticoagulation indicated. Hypercoagulable workup deferred until post-anticoagulation. No malignancy or provoking factors. Emphasized bleeding risk.  - Continued apixaban  5 mg BID through May 2026 for six months anticoagulation.  - Recommended compression stockings for edema.  - Provided education on medication safety with apixaban  and bleeding risk, especially with fall risk.  - Ordered hypercoagulable workup one month post-anticoagulation (end of June 2026).  - Scheduled follow-up two weeks post-blood work to review results and assess ongoing anticoagulation need.  - Coordinated Eliquis  refills with primary provider.     Thank you Dr Cleatus MD. for allowing me to participate in the care of your pleasant patient. Please do not hesitate to contact me with questions or concerns in the interim.  # DISPOSITION: # No labs today-  # follow up in 6/end of june months- MD; 2 weeks prior- ordered labs-Dr.B  All questions were answered. The patient knows to call the clinic with any  problems, questions or concerns.  Cindy JONELLE Joe, MD 08/30/2024 12:15 PM

## 2024-08-30 NOTE — Progress Notes (Signed)
 No questions or concerns at this time

## 2024-09-09 ENCOUNTER — Telehealth: Payer: Self-pay | Admitting: Family Medicine

## 2024-09-09 ENCOUNTER — Other Ambulatory Visit: Payer: Self-pay | Admitting: Family Medicine

## 2024-09-09 DIAGNOSIS — I82401 Acute embolism and thrombosis of unspecified deep veins of right lower extremity: Secondary | ICD-10-CM

## 2024-09-09 MED ORDER — APIXABAN 5 MG PO TABS: 5.0000 mg | ORAL_TABLET | Freq: Two times a day (BID) | ORAL | 3 refills | Status: AC

## 2024-09-09 NOTE — Telephone Encounter (Signed)
 Requesting: Tramadol  (Ultram ) 50 mg tablet Contract: No UDS: no Last Visit: 08/05/2024 Next Visit: 09/09/2024 Last Refill: 07/04/24  Please Advise

## 2024-09-09 NOTE — Telephone Encounter (Signed)
 See below.  Greatly appreciate help from hematology.  Please let patient know that I sent additional refills so that he would have enough medication for 6 months of treatment with Eliquis .  He can take the medication for a total of 6 months, ie through the end of May and then hematology wants to get follow-up labs in June.  Thanks.

## 2024-09-09 NOTE — Telephone Encounter (Signed)
-----   Message from Cindy Joe, MD sent at 08/30/2024 11:43 AM EST ----- Regarding: mutual pt Dr.Ryli Standlee-I met with the patient and wife today-and thank you for the referral.    I would recommend 6 months of anticoagulation-which she will be finishing up around end of May 2026.  In end of June-I will be ordering hypercoagulable workup to assess-the possible cause; and if he needs to be on any long-term anticoagulation.  For now I would defer to you for Eliquis  refills.   Please feel free to reach out to me if any questions or concerns.  Thank you. GB

## 2025-01-31 ENCOUNTER — Inpatient Hospital Stay

## 2025-02-14 ENCOUNTER — Inpatient Hospital Stay: Admitting: Internal Medicine
